# Patient Record
Sex: Female | Born: 1990 | Race: Black or African American | Hispanic: No | Marital: Single | Smoking: Never smoker
Health system: Southern US, Community
[De-identification: ages and names within clinical notes are randomized; demographics above are authoritative.]

## PROBLEM LIST (undated history)

## (undated) DIAGNOSIS — I10 Essential (primary) hypertension: Secondary | ICD-10-CM

## (undated) DIAGNOSIS — Z973 Presence of spectacles and contact lenses: Secondary | ICD-10-CM

## (undated) DIAGNOSIS — R7303 Prediabetes: Secondary | ICD-10-CM

---

## 2011-10-25 ENCOUNTER — Encounter (HOSPITAL_BASED_OUTPATIENT_CLINIC_OR_DEPARTMENT_OTHER): Payer: Self-pay | Admitting: Emergency Medicine

## 2011-10-25 ENCOUNTER — Emergency Department (HOSPITAL_BASED_OUTPATIENT_CLINIC_OR_DEPARTMENT_OTHER): Payer: No Typology Code available for payment source

## 2011-10-25 ENCOUNTER — Emergency Department (HOSPITAL_BASED_OUTPATIENT_CLINIC_OR_DEPARTMENT_OTHER)
Admission: EM | Admit: 2011-10-25 | Discharge: 2011-10-25 | Disposition: A | Discharge status: Home / Self Care | Payer: No Typology Code available for payment source | Attending: Emergency Medicine | Admitting: Emergency Medicine

## 2011-10-25 DIAGNOSIS — M549 Dorsalgia, unspecified: Secondary | ICD-10-CM | POA: Insufficient documentation

## 2011-10-25 DIAGNOSIS — M542 Cervicalgia: Secondary | ICD-10-CM | POA: Insufficient documentation

## 2011-10-25 DIAGNOSIS — M79609 Pain in unspecified limb: Secondary | ICD-10-CM | POA: Insufficient documentation

## 2011-10-25 DIAGNOSIS — M79643 Pain in unspecified hand: Secondary | ICD-10-CM

## 2011-10-25 MED ORDER — CYCLOBENZAPRINE HCL 10 MG PO TABS: 10.0000 mg | ORAL_TABLET | Freq: Two times a day (BID) | ORAL | Status: AC | PRN

## 2011-10-25 MED ORDER — HYDROCODONE-ACETAMINOPHEN 5-325 MG PO TABS: 2.0000 | ORAL_TABLET | ORAL | Status: AC | PRN
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
## 2011-10-26 NOTE — ED Provider Notes (Signed)
Medical screening examination/treatment/procedure(s) were performed by non-physician practitioner and as supervising physician I was immediately available for consultation/collaboration.  Johm Pfannenstiel, MD 10/26/11 2058 

## 2012-07-08 ENCOUNTER — Encounter (HOSPITAL_BASED_OUTPATIENT_CLINIC_OR_DEPARTMENT_OTHER): Payer: Self-pay

## 2012-07-08 ENCOUNTER — Emergency Department (HOSPITAL_BASED_OUTPATIENT_CLINIC_OR_DEPARTMENT_OTHER)
Admission: EM | Admit: 2012-07-08 | Discharge: 2012-07-08 | Disposition: A | Discharge status: Home / Self Care | Payer: Managed Care, Other (non HMO) | Attending: Emergency Medicine | Admitting: Emergency Medicine

## 2012-07-08 DIAGNOSIS — Y929 Unspecified place or not applicable: Secondary | ICD-10-CM | POA: Insufficient documentation

## 2012-07-08 DIAGNOSIS — Y9389 Activity, other specified: Secondary | ICD-10-CM | POA: Insufficient documentation

## 2012-07-08 DIAGNOSIS — T783XXA Angioneurotic edema, initial encounter: Secondary | ICD-10-CM

## 2012-07-08 DIAGNOSIS — T628X1A Toxic effect of other specified noxious substances eaten as food, accidental (unintentional), initial encounter: Secondary | ICD-10-CM | POA: Insufficient documentation

## 2012-07-08 MED ORDER — DIPHENHYDRAMINE HCL 50 MG PO TABS: 50.0000 mg | ORAL_TABLET | Freq: Four times a day (QID) | ORAL | Status: AC

## 2012-07-08 NOTE — ED Provider Notes (Signed)
History     CSN: [id number]  Arrival date & time 07/08/12  4098   None     Chief Complaint  Patient presents with  . Oral Swelling    (Consider location/radiation/quality/duration/timing/severity/associated sxs/prior treatment) HPI 22 yo F presenting with lip swelling.  She states that it started last night after eating [place] food.  She states that it was the same food from the same restaurant that she eats all the time.  No new lip gloss or chapstick.  Denies difficulty swallowing or tongue/throat swelling.  No fevers or chills.  Took some benadryl last night.  History reviewed. No pertinent past medical history.  History reviewed. No pertinent past surgical history.  No family history on file.  History  Substance Use Topics  . Smoking status: Never Smoker   . Smokeless tobacco: Not on file  . Alcohol Use: No    OB History   Grav Para Term Preterm Abortions TAB SAB Ect Mult Living                  Review of Systems  HENT:       Lip swelling  All other systems reviewed and are negative.    Allergies  Review of patient's allergies indicates no known allergies.  Home Medications  No current outpatient prescriptions on file.  BP 137/80  Pulse 76  Temp(Src) 98.1 F (36.7 C) (Oral)  Resp 18  Ht 5\' 10"  (1.778 m)  Wt 300 lb (136.079 kg)  BMI 43.05 kg/m2  SpO2 100%  LMP 06/28/2012  Physical Exam  Constitutional: She is oriented to person, place, and time. She appears well-developed and well-nourished. No distress.  HENT:  Head: Normocephalic and atraumatic.  Mouth/Throat: Oropharynx is clear and moist.    No swelling of tongue or posterior pharynx.  Righ upper lip with some swelling; much improved from last night based on picture.  No surrounding erythema.  Eyes: Conjunctivae are normal. Right eye exhibits no discharge. Left eye exhibits no discharge.  Neck: Normal range of motion.  Cardiovascular: Normal rate, regular rhythm and normal heart sounds.     No murmur heard. Pulmonary/Chest: Effort normal and breath sounds normal. No respiratory distress. She has no wheezes. She has no rales.  Neurological: She is alert and oriented to person, place, and time.  Skin: Skin is warm and dry. She is not diaphoretic.    ED Course  Procedures (including critical care time)  Labs Reviewed - No data to display No results found.   No diagnosis found.    MDM  22 yo otherwise healthy F presenting with lip swelling consistent with allergic reaction.  Unclear precipitant unless the restaurant has changed the seasonings in their food.  Discussed treatment with benadryl 50mg  q6hrs until swelling improves.  Will not give dose here as patient drove herself.  Reviewed warning signs that warrant immediate return including swelling of tongue or throat or difficulty swallowing.  She expressed understanding and agreement with plan.         Phebe Colla, MD 07/08/12 [phone]

## 2012-07-08 NOTE — ED Provider Notes (Signed)
  I performed a history and physical examination of Cassandra Reese and discussed her management with Dr. Despina Hick.  I agree with the history, physical, assessment, and plan of care, with the following exceptions: None  I was present for the following procedures: None Time Spent in Critical Care of the patient: None Time spent in discussions with the patient and family: 61  22 y.o. Female with isolated lip swelling after [place] food yesterday.   Holli Humbles, MD 07/08/12 [phone]

## 2012-07-08 NOTE — ED Notes (Signed)
Upper lip swelling that started last night after eating chinese food.

## 2013-03-11 HISTORY — PX: WISDOM TOOTH EXTRACTION: SHX21

## 2014-11-04 ENCOUNTER — Emergency Department (HOSPITAL_BASED_OUTPATIENT_CLINIC_OR_DEPARTMENT_OTHER)
Admission: EM | Admit: 2014-11-04 | Discharge: 2014-11-04 | Disposition: A | Discharge status: Home / Self Care | Payer: Managed Care, Other (non HMO) | Attending: Emergency Medicine | Admitting: Emergency Medicine

## 2014-11-04 ENCOUNTER — Encounter (HOSPITAL_BASED_OUTPATIENT_CLINIC_OR_DEPARTMENT_OTHER): Payer: Self-pay | Admitting: *Deleted

## 2014-11-04 DIAGNOSIS — L509 Urticaria, unspecified: Secondary | ICD-10-CM | POA: Diagnosis not present

## 2014-11-04 DIAGNOSIS — R21 Rash and other nonspecific skin eruption: Secondary | ICD-10-CM | POA: Diagnosis present

## 2014-11-04 MED ORDER — DIPHENHYDRAMINE HCL 25 MG PO CAPS
25.0000 mg | ORAL_CAPSULE | Freq: Once | ORAL | Status: AC
  Administered 2014-11-04: 25 mg via ORAL
  Filled 2014-11-04: qty 1

## 2014-11-04 NOTE — ED Notes (Signed)
Hives on her chest. No difficulty speaking or swallowing. She has been using Benadryl cream but has not taken PO meds. Recent sinus problems.

## 2014-11-04 NOTE — ED Provider Notes (Signed)
CSN: [id number]     Arrival date & time 11/04/14  1953 History   First MD Initiated Contact with Patient 11/04/14 1959     Chief Complaint  Patient presents with  . Urticaria     (Consider location/radiation/quality/duration/timing/severity/associated sxs/prior Treatment) HPI Comments: 24 year old female presenting with hives on her chest beginning while she was sitting at her desk at work. The hives are very itchy and worsened over the past couple of hours. They have not spread to any other part of her body other than her chest. Denies wheezing, facial swelling, angioedema, difficulty breathing or swallowing. No known allergies. Denies any new soaps, detergents, lotions, foods or medications. Reports dealing with sinus congestion over the past week and has been taking over-the-counter Tylenol but has never had a problem with this in the past. She tried applying Benadryl cream with no relief of the itching.  The history is provided by the patient.    History reviewed. No pertinent past medical history. History reviewed. No pertinent past surgical history. No family history on file. Social History  Substance Use Topics  . Smoking status: Never Smoker   . Smokeless tobacco: None  . Alcohol Use: No   OB History    No data available     Review of Systems  Skin: Positive for rash.  All other systems reviewed and are negative.     Allergies  Review of patient's allergies indicates no known allergies.  Home Medications   Prior to Admission medications   Medication Sig Start Date End Date Taking? Authorizing Provider  diphenhydrAMINE (BENADRYL) 50 MG tablet Take 1 tablet (50 mg total) by mouth every 6 (six) hours. Until swelling resolves 07/08/12   Melony Overly, MD   BP 147/82 mmHg  Pulse 88  Temp(Src) 98.9 F (37.2 C) (Oral)  Resp 18  Ht 5\' 9"  (1.753 m)  Wt 335 lb (151.955 kg)  BMI 49.45 kg/m2  SpO2 99%  LMP 10/08/2014 Physical Exam  Constitutional: She is oriented to  person, place, and time. She appears well-developed and well-nourished. No distress.  HENT:  Head: Normocephalic and atraumatic.  Mouth/Throat: Oropharynx is clear and moist. No uvula swelling.  No angioedema.  Eyes: Conjunctivae and EOM are normal.  Neck: Normal range of motion. Neck supple.  Cardiovascular: Normal rate, regular rhythm and normal heart sounds.   Pulmonary/Chest: Effort normal and breath sounds normal. No respiratory distress.  Musculoskeletal: Normal range of motion. She exhibits no edema.  Neurological: She is alert and oriented to person, place, and time. No sensory deficit.  Skin: Skin is warm and dry.  Few urticaria on chest.  Psychiatric: She has a normal mood and affect. Her behavior is normal.  Nursing note and vitals reviewed.   ED Course  Procedures (including critical care time) Labs Review Labs Reviewed - No data to display  Imaging Review No results found. I have personally reviewed and evaluated these images and lab results as part of my medical decision-making.   EKG Interpretation None      MDM   Final diagnoses:  Urticaria   NAD. AFVSS. No anaphylaxis. Has localized hives to her chest. Advised over-the-counter Benadryl, if no improvement, topical hydrocortisone cream. Stable for discharge. Return precautions given. Patient states understanding of treatment care plan and is agreeable.  Carman Ching, PA-C 11/04/14 2021  Quintella Reichert, MD 11/04/14 2056

## 2014-11-04 NOTE — ED Notes (Signed)
Pt verbalizes understanding of d/c instructions and denies any further needs at this time. 

## 2014-11-04 NOTE — Discharge Instructions (Signed)
Take Benadryl every 4-6 hours as needed for itching and hives. Return with any worsening symptoms.  Hives Hives are itchy, red, swollen areas of the skin. They can vary in size and location on your body. Hives can come and go for hours or several days (acute hives) or for several weeks (chronic hives). Hives do not spread from person to person (noncontagious). They may get worse with scratching, exercise, and emotional stress. CAUSES   Allergic reaction to food, additives, or drugs.  Infections, including the common cold.  Illness, such as vasculitis, lupus, or thyroid disease.  Exposure to sunlight, heat, or cold.  Exercise.  Stress.  Contact with chemicals. SYMPTOMS   Red or white swollen patches on the skin. The patches may change size, shape, and location quickly and repeatedly.  Itching.  Swelling of the hands, feet, and face. This may occur if hives develop deeper in the skin. DIAGNOSIS  Your caregiver can usually tell what is wrong by performing a physical exam. Skin or blood tests may also be done to determine the cause of your hives. In some cases, the cause cannot be determined. TREATMENT  Mild cases usually get better with medicines such as antihistamines. Severe cases may require an emergency epinephrine injection. If the cause of your hives is known, treatment includes avoiding that trigger.  HOME CARE INSTRUCTIONS   Avoid causes that trigger your hives.  Take antihistamines as directed by your caregiver to reduce the severity of your hives. Non-sedating or low-sedating antihistamines are usually recommended. Do not drive while taking an antihistamine.  Take any other medicines prescribed for itching as directed by your caregiver.  Wear loose-fitting clothing.  Keep all follow-up appointments as directed by your caregiver. SEEK MEDICAL CARE IF:   You have persistent or severe itching that is not relieved with medicine.  You have painful or swollen  joints. SEEK IMMEDIATE MEDICAL CARE IF:   You have a fever.  Your tongue or lips are swollen.  You have trouble breathing or swallowing.  You feel tightness in the throat or chest.  You have abdominal pain. These problems may be the first sign of a life-threatening allergic reaction. Call your local emergency services (911 in [place] MAKE SURE YOU:   Understand these instructions.  Will watch your condition.  Will get help right away if you are not doing well or get worse. Document Released: 02/25/2005 Document Revised: 03/02/2013 Document Reviewed: 05/21/2011 [facility] Patient Information 2015 [place], [place]. This information is not intended to replace advice given to you by your health care provider. Make sure you discuss any questions you have with your health care provider.

## 2017-05-12 ENCOUNTER — Other Ambulatory Visit (HOSPITAL_COMMUNITY): Payer: Self-pay | Admitting: General Surgery

## 2017-05-27 ENCOUNTER — Ambulatory Visit (HOSPITAL_COMMUNITY)
Admission: RE | Admit: 2017-05-27 | Discharge: 2017-05-27 | Disposition: A | Discharge status: Home / Self Care | Payer: BLUE CROSS/BLUE SHIELD | Source: Ambulatory Visit | Attending: General Surgery | Admitting: General Surgery

## 2017-05-27 ENCOUNTER — Encounter: Payer: BLUE CROSS/BLUE SHIELD | Attending: General Surgery | Admitting: Skilled Nursing Facility1

## 2017-05-27 ENCOUNTER — Encounter: Payer: Self-pay | Admitting: Skilled Nursing Facility1

## 2017-05-27 ENCOUNTER — Other Ambulatory Visit: Payer: Self-pay

## 2017-05-27 DIAGNOSIS — Z713 Dietary counseling and surveillance: Secondary | ICD-10-CM | POA: Diagnosis present

## 2017-05-27 DIAGNOSIS — E669 Obesity, unspecified: Secondary | ICD-10-CM

## 2017-05-27 DIAGNOSIS — Z6841 Body Mass Index (BMI) 40.0 and over, adult: Secondary | ICD-10-CM | POA: Diagnosis not present

## 2017-05-27 NOTE — Progress Notes (Signed)
Pre-Op Assessment Visit:  Pre-Operative Sleeve Gastrectomy Surgery  Medical Nutrition Therapy:  Appt start time: 3:30  End time:  4:18  Patient was seen on 05/27/2017 for Pre-Operative Nutrition Assessment. Assessment and letter of approval faxed to [facility] Bariatric Surgery Program coordinator on 05/27/2017.  Pt needs 6 SWL.  Pt seems to be in a really good place mentally concerning food ,weight, and surgery.  Pt expectation of surgery: more discipline and more accountable   Pt expectation of Dietitian: to give me some good tips  Start weight at [facility]: 371.6 BMI: 54.88  24 hr Dietary Recall: First Meal: yogurt or boiled egg or cereal or bagel Snack: candy Second Meal: chicken or fish and mixed vegetables and rice or mac n cheese Snack: chips and dip and crackers  Third Meal: small side salad or cereal Snack:  Beverages:water, cream and sugar coffee, lemonade, orange juice  Encouraged to engage in 150 minutes of moderate physical activity including cardiovascular and weight baring weekly  Handouts given during visit include:  . Pre-Op Goals . Bariatric Surgery Protein Shakes During the appointment today the following Pre-Op Goals were reviewed with the patient: . Maintain or lose weight as instructed by your surgeon . Make healthy food choices . Begin to limit portion sizes . Limited concentrated sugars and fried foods . Keep fat/sugar in the single digits per serving on             food labels . Practice CHEWING your food  (aim for 30 chews per bite or until applesauce consistency) . Practice not drinking 15 minutes before, during, and 30 minutes after each meal/snack . Avoid all carbonated beverages  . Avoid/limit caffeinated beverages  . Avoid all sugar-sweetened beverages . Consume 3 meals per day; eat every 3-5 hours . Make a list of non-food related activities . Aim for 64-100 ounces of FLUID daily  . Aim for at least 60-80 grams of PROTEIN  daily . Look for a liquid protein source that contain ?15 g protein and ?5 g carbohydrate  (ex: shakes, drinks, shots)  -Follow diet recommendations listed below   Energy and Macronutrient Recomendations: Calories: 1500 Carbohydrate: 170 Protein: 112 Fat: 42  Demonstrated degree of understanding via:  Teach Back  Teaching Method Utilized: Visual Auditory Hands on  Barriers to learning/adherence to lifestyle change: none identified   Patient to call the Nutrition and Diabetes Education Services to enroll in Pre-Op and Post-Op Nutrition Education when surgery date is scheduled.

## 2017-06-19 ENCOUNTER — Encounter: Payer: BLUE CROSS/BLUE SHIELD | Attending: General Surgery | Admitting: Skilled Nursing Facility1

## 2017-06-19 ENCOUNTER — Encounter: Payer: Self-pay | Admitting: Skilled Nursing Facility1

## 2017-06-19 DIAGNOSIS — Z713 Dietary counseling and surveillance: Secondary | ICD-10-CM | POA: Insufficient documentation

## 2017-06-19 DIAGNOSIS — E669 Obesity, unspecified: Secondary | ICD-10-CM

## 2017-06-19 DIAGNOSIS — Z6841 Body Mass Index (BMI) 40.0 and over, adult: Secondary | ICD-10-CM | POA: Insufficient documentation

## 2017-06-19 NOTE — Progress Notes (Signed)
Assessment:   1st SWL Appointment.  Sleeve Pt states she has increased her water consumption goal to fill her bottle 3 times. Pt states she is more conscious of her chewing. Pt states she has had less coffees but still drinking mocha frapps. Pt states pork and beef are tough for her to digest so she is trying to cut them out. Pt states if her schedule gets really busy she eats out more. Pt states he has church 2 tiems Sunday and eats out more on the weekend. Pt states she is going to [place] in a few days.   Start weight at [facility]: 371.6 Weight: 371.6 BMI: 54.88  24 hr Dietary Recall: First Meal: protein shake or grits and eggs Snack: candy Second Meal: chicken or fish and mixed vegetables and rice or mac n cheese Snack: chips and dip and crackers  Third Meal: small side salad or cereal Snack:  Beverages:water, mocha frapps, lemonade, orange juice  -Follow diet recommendations listed below   Energy and Macronutrient Recomendations: Calories: 1500 Carbohydrate: 170 Protein: 112 Fat: 42  Demonstrated degree of understanding via:  Teach Back  Teaching Method Utilized: Visual Auditory Hands on  Barriers to learning/adherence to lifestyle change: none identified  Nutritional Diagnosis:  [place]-3.3 Overweight/obesity related to past poor dietary habits and physical inactivity as evidenced by patient w/ planned sleeve surgery following dietary guidelines for continued weight loss.    Intervention:  Nutrition counseling for upcoming Bariatric Surgery. Goals: -Encouraged to engage in 150 minutes of moderate physical activity including cardiovascular and weight baring weekly . Practice CHEWING your food  (aim for 30 chews per bite or until applesauce consistency) . Use your meal ideas sheet to cut back on fast food  Teaching Method Utilized:  Visual Auditory Hands on  Handouts given during visit include:  Meal ideas  Low sodium seasonings   Barriers to learning/adherence to  lifestyle change: none identified   Demonstrated degree of understanding via:  Teach Back   Monitoring/Evaluation:  Dietary intake, exercise,and body weight prn.

## 2017-06-25 ENCOUNTER — Ambulatory Visit: Payer: BLUE CROSS/BLUE SHIELD | Admitting: Skilled Nursing Facility1

## 2017-07-16 ENCOUNTER — Encounter: Payer: BLUE CROSS/BLUE SHIELD | Attending: General Surgery | Admitting: Skilled Nursing Facility1

## 2017-07-16 ENCOUNTER — Encounter: Payer: Self-pay | Admitting: Skilled Nursing Facility1

## 2017-07-16 DIAGNOSIS — Z6841 Body Mass Index (BMI) 40.0 and over, adult: Secondary | ICD-10-CM | POA: Insufficient documentation

## 2017-07-16 DIAGNOSIS — Z713 Dietary counseling and surveillance: Secondary | ICD-10-CM | POA: Insufficient documentation

## 2017-07-16 DIAGNOSIS — E669 Obesity, unspecified: Secondary | ICD-10-CM

## 2017-07-16 NOTE — Progress Notes (Signed)
Assessment:  2nd SWL Appointment.  Sleeve  Pt arrives with 5.8 pounds lost. Pt states she had a fantastic time in [place]. Pt states she has been working on chewing well/not drinking with the meals and feels there is room for improvement. Pt states she has been half and half with fast food and has been drinking 96 oz of water.    Start weight at [facility]: 371.6 Weight:365.8 BMI: 54.02  24 hr Dietary Recall: First Meal: protein shake or grits and eggs Snack: candy Second Meal: chicken or fish and mixed vegetables and rice or mac n cheese Snack: chips and dip and crackers  Third Meal: small side salad or cereal Snack:  Beverages:water, mocha frapps, lemonade, orange juice  -Follow diet recommendations listed below   Energy and Macronutrient Recomendations: Calories: 1500 Carbohydrate: 170 Protein: 112 Fat: 42  Demonstrated degree of understanding via:  Teach Back  Teaching Method Utilized: Visual Auditory Hands on  Barriers to learning/adherence to lifestyle change: none identified  Nutritional Diagnosis:  [place]-3.3 Overweight/obesity related to past poor dietary habits and physical inactivity as evidenced by patient w/ planned sleeve surgery following dietary guidelines for continued weight loss.    Intervention:  Nutrition counseling for upcoming Bariatric Surgery. Goals: -Encouraged to engage in 150 minutes of moderate physical activity including cardiovascular and weight baring weekly . Practice CHEWING your food  (aim for 30 chews per bite or until applesauce consistency) . Use your meal ideas sheet to cut back on fast food  Teaching Method Utilized:  Visual Auditory Hands on  Handouts given during visit include:  Meal ideas  Low sodium seasonings   Barriers to learning/adherence to lifestyle change: none identified   Demonstrated degree of understanding via:  Teach Back   Monitoring/Evaluation:  Dietary intake, exercise,and body weight prn.

## 2017-08-20 ENCOUNTER — Encounter: Payer: Self-pay | Admitting: Skilled Nursing Facility1

## 2017-08-20 ENCOUNTER — Encounter: Payer: BLUE CROSS/BLUE SHIELD | Attending: General Surgery | Admitting: Skilled Nursing Facility1

## 2017-08-20 DIAGNOSIS — E669 Obesity, unspecified: Secondary | ICD-10-CM

## 2017-08-20 DIAGNOSIS — Z6841 Body Mass Index (BMI) 40.0 and over, adult: Secondary | ICD-10-CM | POA: Insufficient documentation

## 2017-08-20 DIAGNOSIS — Z713 Dietary counseling and surveillance: Secondary | ICD-10-CM | POA: Diagnosis not present

## 2017-08-20 NOTE — Progress Notes (Signed)
Assessment: 3rd SWL Appointment.  Sleeve Needs 6 SWL.  Pt arrives with 5.8 pounds lost. Pt states she had a fantastic time in [place]. Pt states she has been working on chewing well/not drinking with the meals and feels there is room for improvement. Pt states she has been half and half with fast food and has been drinking 96 oz of water.    Pt states she has been not drinking with meals and drinking more smoothies as meal replacements. Pt states she has had less snacking by stop buying the snacks. Pt states she is going to [place]. Pt states she wants to aim for 3 times at the gym.   Start weight at [facility]: 371.6 Weight:365.8 BMI: 54.02  24 hr Dietary Recall: First Meal: pureed fruit Snack: candy but less Second Meal: chicken or fish and mixed vegetables and rice or mac n cheese Snack: chips and dip and crackers  Third Meal: small side salad or cereal Snack:  Beverages:water, mocha frapps, lemonade, orange juice  -Follow diet recommendations listed below   Energy and Macronutrient Recomendations: Calories: 1500 Carbohydrate: 170 Protein: 112 Fat: 42  Demonstrated degree of understanding via:  Teach Back  Teaching Method Utilized: Visual Auditory Hands on  Barriers to learning/adherence to lifestyle change: none identified  Nutritional Diagnosis:  [place]-3.3 Overweight/obesity related to past poor dietary habits and physical inactivity as evidenced by patient w/ planned sleeve surgery following dietary guidelines for continued weight loss.    Intervention:  Nutrition counseling for upcoming Bariatric Surgery. Goals: -Encouraged to engage in 150 minutes of moderate physical activity including cardiovascular and weight baring weekly . Practice CHEWING your food  (aim for 30 chews per bite or until applesauce consistency) . Use your meal ideas sheet to cut back on fast food . Get to the gym 3 days a week  Teaching Method Utilized:  Visual Auditory Hands on  Handouts  given during visit include:  Meal ideas  Low sodium seasonings   Barriers to learning/adherence to lifestyle change: none identified   Demonstrated degree of understanding via:  Teach Back   Monitoring/Evaluation:  Dietary intake, exercise,and body weight prn.

## 2017-09-18 ENCOUNTER — Encounter: Payer: BLUE CROSS/BLUE SHIELD | Attending: General Surgery | Admitting: Skilled Nursing Facility1

## 2017-09-18 ENCOUNTER — Encounter: Payer: Self-pay | Admitting: Skilled Nursing Facility1

## 2017-09-18 DIAGNOSIS — Z713 Dietary counseling and surveillance: Secondary | ICD-10-CM | POA: Insufficient documentation

## 2017-09-18 DIAGNOSIS — Z6841 Body Mass Index (BMI) 40.0 and over, adult: Secondary | ICD-10-CM | POA: Diagnosis not present

## 2017-09-18 DIAGNOSIS — E669 Obesity, unspecified: Secondary | ICD-10-CM

## 2017-09-18 NOTE — Progress Notes (Signed)
Assessment: 3rd SWL Appointment.  Sleeve Needs 6 SWL.  Pt arrives with 5.8 pounds lost. Pt states she had a fantastic time in [place]. Pt states she has been working on chewing well/not drinking with the meals and feels there is room for improvement. Pt states she has been half and half with fast food and has been drinking 96 oz of water.    Pt states she has been not drinking with meals and drinking more smoothies as meal replacements.   Pt arrives having lost about 1 pound. Pt state she no longer has snacks at her desk. Pt states she is eating until satisfaction instead of full and throwing the rest of her food away.    Start weight at [facility]: 371.6 Weight:364 BMI: 54.02  24 hr Dietary Recall: First Meal: pureed fruit or coffee drink or shake or breakfast sandwich Snack: candy but less Second Meal: sandwich or salad Snack:  Third Meal: small side salad or cereal or leftovers from lunch like chinese or habachi Snack:  Beverages:water, mocha frapps, lemonade, orange juice  -Follow diet recommendations listed below   Energy and Macronutrient Recomendations: Calories: 1500 Carbohydrate: 170 Protein: 112 Fat: 42  Demonstrated degree of understanding via:  Teach Back  Teaching Method Utilized: Visual Auditory Hands on  Barriers to learning/adherence to lifestyle change: none identified  Nutritional Diagnosis:  [place]-3.3 Overweight/obesity related to past poor dietary habits and physical inactivity as evidenced by patient w/ planned sleeve surgery following dietary guidelines for continued weight loss.    Intervention:  Nutrition counseling for upcoming Bariatric Surgery. Goals: -Encouraged to engage in 150 minutes of moderate physical activity including cardiovascular and weight baring weekly . Practice CHEWING your food  (aim for 30 chews per bite or until applesauce consistency) . Use your meal ideas sheet to cut back on fast food . Get to the gym 3 days a week: starting this  coming Monday  . Try kefir  Teaching Method Utilized:  Visual Auditory Hands on  Handouts given during visit include:  Meal ideas  Low sodium seasonings   Barriers to learning/adherence to lifestyle change: none identified   Demonstrated degree of understanding via:  Teach Back   Monitoring/Evaluation:  Dietary intake, exercise,and body weight prn.

## 2017-10-21 ENCOUNTER — Encounter: Payer: Self-pay | Admitting: Skilled Nursing Facility1

## 2017-10-21 ENCOUNTER — Encounter: Payer: BLUE CROSS/BLUE SHIELD | Attending: General Surgery | Admitting: Skilled Nursing Facility1

## 2017-10-21 DIAGNOSIS — E669 Obesity, unspecified: Secondary | ICD-10-CM

## 2017-10-21 DIAGNOSIS — Z6841 Body Mass Index (BMI) 40.0 and over, adult: Secondary | ICD-10-CM | POA: Diagnosis not present

## 2017-10-21 DIAGNOSIS — Z713 Dietary counseling and surveillance: Secondary | ICD-10-CM | POA: Diagnosis present

## 2017-10-21 NOTE — Progress Notes (Signed)
Assessment: 5th SWL Appointment.  Sleeve Needs 6 SWL.   Pt arrives having lost about 1 pound. Pt states she has been walking 2 miles a day and states she love it. Pt states she has been packing her lunches for work and eating out 2 times a week. Pt states she is not beating herself up for missing the gym. Pt states she really enjoys sweating because ot makes her feel accomplished. Pt states she [place] disappointed with how hard she has worked but has only lost 1 pound.    Start weight at [facility]: 371.6 Weight:363 BMI: 53.61  24 hr Dietary Recall: First Meal: pureed fruit or coffee drink or shake or breakfast sandwich Snack: candy but less Second Meal: sandwich or salad Snack:  Third Meal: small side salad or cereal or leftovers from lunch like chinese or habachi Snack:  Beverages:water, mocha frapps, lemonade, orange juice  -Follow diet recommendations listed below   Energy and Macronutrient Recomendations: Calories: 1500 Carbohydrate: 170 Protein: 112 Fat: 42  Demonstrated degree of understanding via:  Teach Back  Teaching Method Utilized: Visual Auditory Hands on  Barriers to learning/adherence to lifestyle change: none identified  Nutritional Diagnosis:  [place]-3.3 Overweight/obesity related to past poor dietary habits and physical inactivity as evidenced by patient w/ planned sleeve surgery following dietary guidelines for continued weight loss.    Intervention:  Nutrition counseling for upcoming Bariatric Surgery. Goals: -Encouraged to engage in 150 minutes of moderate physical activity including cardiovascular and weight baring weekly . Practice CHEWING your food  (aim for 30 chews per bite or until applesauce consistency) . Use your meal ideas sheet to cut back on fast food . Get to the gym 3 days a week: starting this coming Monday  . Try kefir -Keep working on it! Great Job! Teaching Method Utilized:  Visual Auditory Hands on  Handouts given during visit  include:  Meal ideas  Low sodium seasonings   Barriers to learning/adherence to lifestyle change: none identified   Demonstrated degree of understanding via:  Teach Back   Monitoring/Evaluation:  Dietary intake, exercise,and body weight prn.

## 2017-11-24 ENCOUNTER — Encounter: Payer: BLUE CROSS/BLUE SHIELD | Attending: General Surgery | Admitting: Skilled Nursing Facility1

## 2017-11-24 ENCOUNTER — Encounter: Payer: Self-pay | Admitting: Skilled Nursing Facility1

## 2017-11-24 DIAGNOSIS — Z713 Dietary counseling and surveillance: Secondary | ICD-10-CM | POA: Insufficient documentation

## 2017-11-24 DIAGNOSIS — Z6841 Body Mass Index (BMI) 40.0 and over, adult: Secondary | ICD-10-CM | POA: Insufficient documentation

## 2017-11-24 DIAGNOSIS — E669 Obesity, unspecified: Secondary | ICD-10-CM

## 2017-11-24 NOTE — Progress Notes (Signed)
Assessment: 6th SWL Appointment.  Sleeve Needs 6 SWL.   Pt arrives having lost about 2 pounds. Pt states she will struggle with drinking out of a [place]. Pt states she has no longer snacked on snack foods and eats out of boredom which she feels very proud of.   Physical activity: gym 1-2 times a week and moving more throughout the week  Start weight at [facility]: 371.6 Weight:361.4 BMI: 53.37  24 hr Dietary Recall: First Meal: pureed fruit or coffee drink or shake or breakfast sandwich or yogurt Snack:  Second Meal: sandwich or salad Snack:  Third Meal: small side salad or cereal or leftovers from lunch like chinese or habachi or fish with broccoli and brown rice  Snack:  Beverages: water, mocha frapps, lemonade, orange juice  -Follow diet recommendations listed below   Energy and Macronutrient Recomendations: Calories: 1500 Carbohydrate: 170 Protein: 112 Fat: 42  Demonstrated degree of understanding via:  Teach Back  Teaching Method Utilized: Visual Auditory Hands on  Barriers to learning/adherence to lifestyle change: none identified  Nutritional Diagnosis:  [place]-3.3 Overweight/obesity related to past poor dietary habits and physical inactivity as evidenced by patient w/ planned sleeve surgery following dietary guidelines for continued weight loss.    Intervention:  Nutrition counseling for upcoming Bariatric Surgery. Goals: -Encouraged to engage in 150 minutes of moderate physical activity including cardiovascular and weight baring weekly . Practice CHEWING your food  (aim for 30 chews per bite or until applesauce consistency) . Use your meal ideas sheet to cut back on fast food . Get to the gym 3 days a week: starting this coming Monday  -Keep working on it! Great Job! Teaching Method Utilized:  Visual Auditory Hands on  Handouts given during visit include:  Meal ideas  Low sodium seasonings   Barriers to learning/adherence to lifestyle change: none identified    Demonstrated degree of understanding via:  Teach Back   Monitoring/Evaluation:  Dietary intake, exercise,and body weight prn.

## 2017-12-08 ENCOUNTER — Encounter: Payer: BLUE CROSS/BLUE SHIELD | Admitting: Skilled Nursing Facility1

## 2017-12-08 DIAGNOSIS — E669 Obesity, unspecified: Secondary | ICD-10-CM

## 2017-12-08 DIAGNOSIS — Z713 Dietary counseling and surveillance: Secondary | ICD-10-CM | POA: Diagnosis not present

## 2017-12-10 ENCOUNTER — Encounter: Payer: Self-pay | Admitting: Skilled Nursing Facility1

## 2017-12-10 NOTE — Progress Notes (Signed)
Pre-Operative Nutrition Class:  Appt start time: 8979   End time:  1830.  Patient was seen on 12/08/2017 for Pre-Operative Bariatric Surgery Education at the [facility].   Surgery date:  Surgery type: sleeve [facility] weight at [facility]: 371.6 Weight today: 369.8  Samples given per MNT protocol. Patient educated on appropriate usage: Bariatric Advantage Multivitamin Lot # [id number] Exp: 10/20  Bariatric Advantage Calcium  Lot # [id number] Exp: aug 23/2020   Unjury Protein Powder  Shake Lot # [id number] Exp: march 08/20 The following the learning objectives were met by the patient during this course:  Identify Pre-Op Dietary Goals and will begin 2 weeks pre-operatively  Identify appropriate sources of fluids and proteins   State protein recommendations and appropriate sources pre and post-operatively  Identify Post-Operative Dietary Goals and will follow for 2 weeks post-operatively  Identify appropriate multivitamin and calcium sources  Describe the need for physical activity post-operatively and will follow MD recommendations  State when to call healthcare provider regarding medication questions or post-operative complications  Handouts given during class include:  Pre-Op Bariatric Surgery Diet Handout  Protein Shake Handout  Post-Op Bariatric Surgery Nutrition Handout  BELT Program Information Flyer  Support Group Information Flyer  [facility] Bariatric Supplements Price List  Follow-[facility] Plan: [facility] for diet advancement per MD.

## 2019-02-20 DIAGNOSIS — U071 COVID-19: Secondary | ICD-10-CM

## 2019-02-20 HISTORY — DX: COVID-19: U07.1

## 2019-06-28 ENCOUNTER — Telehealth: Payer: Self-pay | Admitting: Family

## 2019-06-28 NOTE — Telephone Encounter (Signed)
lmom and mailed appointment letter to confirm patient referral appointment 5/5 at 130 pm

## 2019-07-09 ENCOUNTER — Other Ambulatory Visit: Payer: Self-pay | Admitting: Family

## 2019-07-09 DIAGNOSIS — D649 Anemia, unspecified: Secondary | ICD-10-CM

## 2019-07-12 ENCOUNTER — Encounter: Payer: Self-pay | Admitting: Family

## 2019-07-12 ENCOUNTER — Other Ambulatory Visit: Payer: Self-pay | Admitting: Family

## 2019-07-12 ENCOUNTER — Inpatient Hospital Stay: Payer: BC Managed Care – PPO | Attending: Family

## 2019-07-12 ENCOUNTER — Inpatient Hospital Stay (HOSPITAL_BASED_OUTPATIENT_CLINIC_OR_DEPARTMENT_OTHER): Payer: BC Managed Care – PPO | Admitting: Family

## 2019-07-12 ENCOUNTER — Other Ambulatory Visit: Payer: Self-pay

## 2019-07-12 VITALS — BP 140/74 | HR 72 | Temp 97.3°F | Resp 18 | Ht 69.0 in | Wt 355.0 lb

## 2019-07-12 DIAGNOSIS — N921 Excessive and frequent menstruation with irregular cycle: Secondary | ICD-10-CM | POA: Diagnosis not present

## 2019-07-12 DIAGNOSIS — Z8616 Personal history of COVID-19: Secondary | ICD-10-CM | POA: Diagnosis not present

## 2019-07-12 DIAGNOSIS — D500 Iron deficiency anemia secondary to blood loss (chronic): Secondary | ICD-10-CM | POA: Insufficient documentation

## 2019-07-12 DIAGNOSIS — D649 Anemia, unspecified: Secondary | ICD-10-CM

## 2019-07-12 HISTORY — DX: Excessive and frequent menstruation with irregular cycle: N92.1

## 2019-07-12 HISTORY — DX: Iron deficiency anemia secondary to blood loss (chronic): D50.0

## 2019-07-12 LAB — IRON AND TIBC
Iron: 7 ug/dL — ABNORMAL LOW (ref 41–142)
Saturation Ratios: 2 % — ABNORMAL LOW (ref 21–57)
TIBC: 359 ug/dL (ref 236–444)
UIBC: 352 ug/dL (ref 120–384)

## 2019-07-12 LAB — CBC WITH DIFFERENTIAL (CANCER CENTER ONLY)
Abs Immature Granulocytes: 0.07 10*3/uL (ref 0.00–0.07)
Basophils Absolute: 0 10*3/uL (ref 0.0–0.1)
Basophils Relative: 0 %
Eosinophils Absolute: 0 10*3/uL (ref 0.0–0.5)
Eosinophils Relative: 1 %
HCT: 28.8 % — ABNORMAL LOW (ref 36.0–46.0)
Hemoglobin: 7.8 g/dL — ABNORMAL LOW (ref 12.0–15.0)
Immature Granulocytes: 1 %
Lymphocytes Relative: 33 %
Lymphs Abs: 2.2 10*3/uL (ref 0.7–4.0)
MCH: 16.6 pg — ABNORMAL LOW (ref 26.0–34.0)
MCHC: 27.1 g/dL — ABNORMAL LOW (ref 30.0–36.0)
MCV: 61.3 fL — ABNORMAL LOW (ref 80.0–100.0)
Monocytes Absolute: 0.4 10*3/uL (ref 0.1–1.0)
Monocytes Relative: 7 %
Neutro Abs: 3.9 10*3/uL (ref 1.7–7.7)
Neutrophils Relative %: 58 %
Platelet Count: 424 10*3/uL — ABNORMAL HIGH (ref 150–400)
RBC: 4.7 MIL/uL (ref 3.87–5.11)
RDW: 18.1 % — ABNORMAL HIGH (ref 11.5–15.5)
WBC Count: 6.6 10*3/uL (ref 4.0–10.5)
nRBC: 0 % (ref 0.0–0.2)

## 2019-07-12 LAB — CMP (CANCER CENTER ONLY)
ALT: 7 U/L (ref 0–44)
AST: 10 U/L — ABNORMAL LOW (ref 15–41)
Albumin: 3.7 g/dL (ref 3.5–5.0)
Alkaline Phosphatase: 61 U/L (ref 38–126)
Anion gap: 7 (ref 5–15)
BUN: 7 mg/dL (ref 6–20)
CO2: 29 mmol/L (ref 22–32)
Calcium: 9.1 mg/dL (ref 8.9–10.3)
Chloride: 103 mmol/L (ref 98–111)
Creatinine: 0.69 mg/dL (ref 0.44–1.00)
GFR, Est AFR Am: >60 mL/min (ref >60)
GFR, Estimated: >60 mL/min (ref >60)
Glucose, Bld: 101 mg/dL — ABNORMAL HIGH (ref 70–99)
Potassium: 3.9 mmol/L (ref 3.5–5.1)
Sodium: 139 mmol/L (ref 135–145)
Total Bilirubin: 0.4 mg/dL (ref 0.3–1.2)
Total Protein: 7.2 g/dL (ref 6.5–8.1)

## 2019-07-12 LAB — LACTATE DEHYDROGENASE: LDH: 200 U/L — ABNORMAL HIGH (ref 98–192)

## 2019-07-12 LAB — RETICULOCYTES
Immature Retic Fract: 18.9 % — ABNORMAL HIGH (ref 2.3–15.9)
RBC.: 4.7 MIL/uL (ref 3.87–5.11)
Retic Count, Absolute: 56.9 10*3/uL (ref 19.0–186.0)
Retic Ct Pct: 1.2 % (ref 0.4–3.1)

## 2019-07-12 LAB — SAVE SMEAR(SSMR), FOR PROVIDER SLIDE REVIEW

## 2019-07-12 LAB — FERRITIN: Ferritin: 7 ng/mL — ABNORMAL LOW (ref 11–307)

## 2019-07-12 NOTE — Progress Notes (Signed)
Hematology/Oncology Consultation   Name: Cassandra Reese      MRN: [id number]    Location: Room/bed info not found  Date: 07/12/2019 Time:9:01 AM   REFERRING PHYSICIAN: Linda Hedges, DO  REASON FOR CONSULT: Anemia    DIAGNOSIS:   HISTORY OF PRESENT ILLNESS: Cassandra Reese is a very pleasant 29 yo African American female with history of iron deficiency anemia secondary to heavy cycles.  She has an appointment next week for transvaginal US. She states that her uterus is enlarged.  Her cycles last 5 days with the first 2 being heavy and she has noted clots.  No other blood loss noted. No bruising or petechiae.  She denies pregnancy or miscarriage.  She is symptomatic with fatigued, weakness, SOB at times, occasional numbess and tingling in her hands and feet which seems positional.  She states that most of her immediate family has issues with anemia as well.  There is the sickle cell trait in her family but she thinks she was previously tested and was negative.   No personal or familial cancer history.  She had her wisdom teeth removed in 2015 without any complications.  She had Covid in December and thankfully has recovered nicely.  No fever, chills, n/v, cough, rash, dizziness, chest pain, palpitations, abdominal pain or changes in bowel or bladder habits.  She has occasional puffiness in her feet and ankles when she hasn't had enough water.  No swelling or tenderness in her extremities at this time.  No falls or syncope.  She has maintained a good appetite and is staying well hydrated. Her weight is stable.  She does not smoke, use recreational drugs or drink alcoholic beverages.  She works in Editor, commissioning for [employer].   ROS: All other 10 point review of systems is negative.   PAST MEDICAL HISTORY:   No past medical history on file.  ALLERGIES: No Known Allergies    MEDICATIONS:  Current Outpatient Medications on File Prior to Visit  Medication Sig Dispense Refill  .  diphenhydrAMINE (BENADRYL) 50 MG tablet Take 1 tablet (50 mg total) by mouth every 6 (six) hours. Until swelling resolves 30 tablet 0   No current facility-administered medications on file prior to visit.     PAST SURGICAL HISTORY No past surgical history on file.  FAMILY HISTORY: No family history on file.  SOCIAL HISTORY:  reports that she has never smoked. She does not have any smokeless tobacco history on file. She reports that she does not drink alcohol or use drugs.  PERFORMANCE STATUS: The patient's performance status is 1 - Symptomatic but completely ambulatory  PHYSICAL EXAM: Most Recent Vital Signs: There were no vitals taken for this visit. There were no vitals taken for this visit.  General Appearance:    Alert, cooperative, no distress, appears stated age  Head:    Normocephalic, without obvious abnormality, atraumatic  Eyes:    PERRL, conjunctiva/corneas clear, EOM's intact, fundi    benign, both eyes        Throat:   Lips, mucosa, and tongue normal; teeth and gums normal  Neck:   Supple, symmetrical, trachea midline, no adenopathy;    thyroid:  no enlargement/tenderness/nodules; no carotid   bruit or JVD  Back:     Symmetric, no curvature, ROM normal, no CVA tenderness  Lungs:     Clear to auscultation bilaterally, respirations unlabored  Chest Wall:    No tenderness or deformity   Heart:    Regular rate and rhythm,  S1 and S2 normal, no murmur, rub   or gallop     Abdomen:     Soft, non-tender, bowel sounds active all four quadrants,    no masses, no organomegaly        Extremities:   Extremities normal, atraumatic, no cyanosis or edema  Pulses:   2+ and symmetric all extremities  Skin:   Skin color, texture, turgor normal, no rashes or lesions  Lymph nodes:   Cervical, supraclavicular, and axillary nodes normal  Neurologic:   CNII-XII intact, normal strength, sensation and reflexes    throughout    LABORATORY DATA:  No results found for this or any  previous visit (from the past 48 hour(s)).    RADIOGRAPHY: No results found.     PATHOLOGY: None   ASSESSMENT/PLAN: Cassandra Reese is a very pleasant 29 yo African American female with history of iron deficiency anemia secondary to heavy cycles.  Hgb is low at 7.8 and MCV 61.  We will see what her iron studies and thalassemia testing look like.  She may benefit from IV iron as well as folic acid.  Once we have her results we will get follow-up scheduled.   All questions were answered and she is in agreement. She will contact our office with any questions or concerns. We can certainly see her sooner if needed.   She was discussed with and also seen by Dr. Marin Olp and he is in agreement with the aforementioned.   Laverna Peace, NP    Addendum: I saw and examined Cassandra Reese with Judson Roch.  I agree with Meghan Warshawsky's assessment.  Clearly, we have significant iron deficiency anemia.  Her MCV is only 61.  Her platelets are on the higher side at 4-24,000.  She has heavy monthly cycles.  I would like to hope that the gynecologist will be able to help out with this.  Somehow, the monthly cycles need to slow down a little bit.  There is no history of heavy bleeding in the family.  No history of sickle cell.  I looked at her blood smear.  I really do not see anything on the blood smear that looked suspicious.  She had no nucleated red blood cells.  I saw no target cells.  There were no schistocytes.  White blood cells appeared mature.  There were no hypersegmented polys.  Platelets were slow and increased in number.  Platelets appear small in size.  They were well granulated.  We will end up given her IV iron, I am sure.  How much iron will depend on what her iron levels are.  We spent about 45 minutes with Cassandra Reese.  She is very nice.  She works for [employer].  We had a good time talking about that.  We will likely plan to get her back to see [place] in about 2 months.

## 2019-07-13 LAB — ERYTHROPOIETIN: Erythropoietin: 182.6 m[IU]/mL — ABNORMAL HIGH (ref 2.6–18.5)

## 2019-07-14 ENCOUNTER — Ambulatory Visit: Payer: BLUE CROSS/BLUE SHIELD | Admitting: Family

## 2019-07-14 ENCOUNTER — Inpatient Hospital Stay: Payer: BLUE CROSS/BLUE SHIELD

## 2019-07-14 LAB — HGB FRACTIONATION CASCADE
Hgb A2: 1.8 % (ref 1.8–3.2)
Hgb A: 98.2 % (ref 96.4–98.8)
Hgb F: 0 % (ref 0.0–2.0)
Hgb S: 0 %

## 2019-07-19 ENCOUNTER — Inpatient Hospital Stay: Payer: BC Managed Care – PPO

## 2019-07-19 ENCOUNTER — Other Ambulatory Visit: Payer: Self-pay

## 2019-07-19 VITALS — BP 134/80 | HR 67 | Temp 97.0°F | Resp 18

## 2019-07-19 DIAGNOSIS — D500 Iron deficiency anemia secondary to blood loss (chronic): Secondary | ICD-10-CM | POA: Diagnosis not present

## 2019-07-19 DIAGNOSIS — N921 Excessive and frequent menstruation with irregular cycle: Secondary | ICD-10-CM

## 2019-07-19 MED ORDER — SODIUM CHLORIDE 0.9 % IV SOLN
Freq: Once | INTRAVENOUS | Status: AC
  Filled 2019-07-19: qty 250

## 2019-07-19 MED ORDER — SODIUM CHLORIDE 0.9 % IV SOLN
200.0000 mg | Freq: Once | INTRAVENOUS | Status: AC
  Administered 2019-07-19: 200 mg via INTRAVENOUS
  Filled 2019-07-19: qty 200

## 2019-07-19 NOTE — Patient Instructions (Signed)

## 2019-07-21 DIAGNOSIS — D259 Leiomyoma of uterus, unspecified: Secondary | ICD-10-CM | POA: Insufficient documentation

## 2019-07-21 LAB — ALPHA-THALASSEMIA GENOTYPR

## 2019-07-26 ENCOUNTER — Other Ambulatory Visit: Payer: Self-pay

## 2019-07-26 ENCOUNTER — Inpatient Hospital Stay: Payer: BC Managed Care – PPO

## 2019-07-26 VITALS — BP 147/90 | HR 69 | Temp 97.3°F | Resp 18

## 2019-07-26 DIAGNOSIS — D500 Iron deficiency anemia secondary to blood loss (chronic): Secondary | ICD-10-CM

## 2019-07-26 DIAGNOSIS — N921 Excessive and frequent menstruation with irregular cycle: Secondary | ICD-10-CM

## 2019-07-26 MED ORDER — SODIUM CHLORIDE 0.9 % IV SOLN
Freq: Once | INTRAVENOUS | Status: AC
  Filled 2019-07-26: qty 250

## 2019-07-26 MED ORDER — SODIUM CHLORIDE 0.9 % IV SOLN
200.0000 mg | Freq: Once | INTRAVENOUS | Status: AC
  Administered 2019-07-26: 200 mg via INTRAVENOUS
  Filled 2019-07-26: qty 10

## 2019-07-26 NOTE — Patient Instructions (Signed)

## 2019-08-02 ENCOUNTER — Other Ambulatory Visit: Payer: Self-pay

## 2019-08-02 ENCOUNTER — Inpatient Hospital Stay: Payer: BC Managed Care – PPO

## 2019-08-02 VITALS — BP 140/87 | HR 70 | Temp 97.5°F | Resp 18

## 2019-08-02 DIAGNOSIS — N921 Excessive and frequent menstruation with irregular cycle: Secondary | ICD-10-CM

## 2019-08-02 DIAGNOSIS — D500 Iron deficiency anemia secondary to blood loss (chronic): Secondary | ICD-10-CM

## 2019-08-02 MED ORDER — SODIUM CHLORIDE 0.9 % IV SOLN
200.0000 mg | Freq: Once | INTRAVENOUS | Status: AC
  Administered 2019-08-02: 200 mg via INTRAVENOUS
  Filled 2019-08-02: qty 200

## 2019-08-02 MED ORDER — SODIUM CHLORIDE 0.9 % IV SOLN
Freq: Once | INTRAVENOUS | Status: AC
  Filled 2019-08-02: qty 250

## 2019-08-02 NOTE — Patient Instructions (Signed)

## 2019-08-06 ENCOUNTER — Other Ambulatory Visit: Payer: Self-pay

## 2019-08-06 ENCOUNTER — Inpatient Hospital Stay: Payer: BC Managed Care – PPO

## 2019-08-06 VITALS — BP 126/49 | HR 75 | Temp 98.1°F | Resp 18

## 2019-08-06 DIAGNOSIS — D500 Iron deficiency anemia secondary to blood loss (chronic): Secondary | ICD-10-CM | POA: Diagnosis not present

## 2019-08-06 DIAGNOSIS — N921 Excessive and frequent menstruation with irregular cycle: Secondary | ICD-10-CM

## 2019-08-06 MED ORDER — SODIUM CHLORIDE 0.9 % IV SOLN
Freq: Once | INTRAVENOUS | Status: AC
  Filled 2019-08-06: qty 250

## 2019-08-06 MED ORDER — SODIUM CHLORIDE 0.9 % IV SOLN
200.0000 mg | Freq: Once | INTRAVENOUS | Status: AC
  Administered 2019-08-06: 200 mg via INTRAVENOUS
  Filled 2019-08-06: qty 200

## 2019-08-06 NOTE — Patient Instructions (Signed)

## 2019-08-10 ENCOUNTER — Ambulatory Visit: Payer: BC Managed Care – PPO

## 2019-08-11 ENCOUNTER — Inpatient Hospital Stay: Payer: BC Managed Care – PPO | Attending: Family

## 2019-08-11 ENCOUNTER — Other Ambulatory Visit: Payer: Self-pay

## 2019-08-11 VITALS — BP 123/47 | HR 67 | Temp 97.8°F | Resp 18

## 2019-08-11 DIAGNOSIS — D500 Iron deficiency anemia secondary to blood loss (chronic): Secondary | ICD-10-CM

## 2019-08-11 DIAGNOSIS — N921 Excessive and frequent menstruation with irregular cycle: Secondary | ICD-10-CM | POA: Insufficient documentation

## 2019-08-11 MED ORDER — SODIUM CHLORIDE 0.9 % IV SOLN
Freq: Once | INTRAVENOUS | Status: AC
  Filled 2019-08-11: qty 250

## 2019-08-11 MED ORDER — SODIUM CHLORIDE 0.9 % IV SOLN
200.0000 mg | Freq: Once | INTRAVENOUS | Status: AC
  Administered 2019-08-11: 200 mg via INTRAVENOUS
  Filled 2019-08-11: qty 200

## 2019-08-11 NOTE — Patient Instructions (Signed)

## 2019-08-16 ENCOUNTER — Ambulatory Visit: Payer: BC Managed Care – PPO

## 2019-08-25 ENCOUNTER — Telehealth: Payer: Self-pay | Admitting: Family

## 2019-08-25 ENCOUNTER — Inpatient Hospital Stay (HOSPITAL_BASED_OUTPATIENT_CLINIC_OR_DEPARTMENT_OTHER): Payer: BC Managed Care – PPO | Admitting: Family

## 2019-08-25 ENCOUNTER — Other Ambulatory Visit: Payer: Self-pay

## 2019-08-25 ENCOUNTER — Inpatient Hospital Stay: Payer: BC Managed Care – PPO

## 2019-08-25 ENCOUNTER — Other Ambulatory Visit: Payer: Self-pay | Admitting: *Deleted

## 2019-08-25 ENCOUNTER — Encounter: Payer: Self-pay | Admitting: Family

## 2019-08-25 VITALS — BP 152/90 | HR 55 | Temp 97.9°F | Resp 18 | Wt 351.8 lb

## 2019-08-25 DIAGNOSIS — D649 Anemia, unspecified: Secondary | ICD-10-CM

## 2019-08-25 DIAGNOSIS — N921 Excessive and frequent menstruation with irregular cycle: Secondary | ICD-10-CM | POA: Diagnosis not present

## 2019-08-25 DIAGNOSIS — D500 Iron deficiency anemia secondary to blood loss (chronic): Secondary | ICD-10-CM

## 2019-08-25 LAB — CBC WITH DIFFERENTIAL (CANCER CENTER ONLY)
Abs Immature Granulocytes: 0.02 10*3/uL (ref 0.00–0.07)
Basophils Absolute: 0 10*3/uL (ref 0.0–0.1)
Basophils Relative: 0 %
Eosinophils Absolute: 0.1 10*3/uL (ref 0.0–0.5)
Eosinophils Relative: 1 %
HCT: 34.8 % — ABNORMAL LOW (ref 36.0–46.0)
Hemoglobin: 9.6 g/dL — ABNORMAL LOW (ref 12.0–15.0)
Immature Granulocytes: 0 %
Lymphocytes Relative: 32 %
Lymphs Abs: 2 10*3/uL (ref 0.7–4.0)
MCH: 19.2 pg — ABNORMAL LOW (ref 26.0–34.0)
MCHC: 27.6 g/dL — ABNORMAL LOW (ref 30.0–36.0)
MCV: 69.5 fL — ABNORMAL LOW (ref 80.0–100.0)
Monocytes Absolute: 0.3 10*3/uL (ref 0.1–1.0)
Monocytes Relative: 5 %
Neutro Abs: 3.9 10*3/uL (ref 1.7–7.7)
Neutrophils Relative %: 62 %
Platelet Count: 363 10*3/uL (ref 150–400)
RBC: 5.01 MIL/uL (ref 3.87–5.11)
RDW: 26.7 % — ABNORMAL HIGH (ref 11.5–15.5)
WBC Count: 6.4 10*3/uL (ref 4.0–10.5)
nRBC: 0 % (ref 0.0–0.2)

## 2019-08-25 LAB — CMP (CANCER CENTER ONLY)
ALT: 7 U/L (ref 0–44)
AST: 9 U/L — ABNORMAL LOW (ref 15–41)
Albumin: 3.5 g/dL (ref 3.5–5.0)
Alkaline Phosphatase: 65 U/L (ref 38–126)
Anion gap: 8 (ref 5–15)
BUN: 6 mg/dL (ref 6–20)
CO2: 29 mmol/L (ref 22–32)
Calcium: 9 mg/dL (ref 8.9–10.3)
Chloride: 106 mmol/L (ref 98–111)
Creatinine: 0.74 mg/dL (ref 0.44–1.00)
GFR, Est AFR Am: >60 mL/min (ref >60)
GFR, Estimated: >60 mL/min (ref >60)
Glucose, Bld: 129 mg/dL — ABNORMAL HIGH (ref 70–99)
Potassium: 3.8 mmol/L (ref 3.5–5.1)
Sodium: 143 mmol/L (ref 135–145)
Total Bilirubin: 0.4 mg/dL (ref 0.3–1.2)
Total Protein: 6.6 g/dL (ref 6.5–8.1)

## 2019-08-25 LAB — IRON AND TIBC
Iron: 27 ug/dL — ABNORMAL LOW (ref 41–142)
Saturation Ratios: 9 % — ABNORMAL LOW (ref 21–57)
TIBC: 305 ug/dL (ref 236–444)
UIBC: 278 ug/dL (ref 120–384)

## 2019-08-25 LAB — FERRITIN: Ferritin: 55 ng/mL (ref 11–307)

## 2019-08-25 NOTE — Progress Notes (Signed)
Hematology and Oncology Follow Up Visit  Cassandra Reese [id number] Jul 17, 1990 29 y.o. 08/25/2019   Principle Diagnosis:  Iron deficiency anemia secondary to heavy cycles - fibroids  Current Therapy: IV iron as indicated    Interim History:  Cassandra Reese is here today for follow-up. She is doing fairly well but had a very heavy, painful period last week. She has discussed this with her gynecologist and they are working on a plan to treat her more effectively. She has Lysteda to take when she starts each cycle but this does not sound like it has been effective.  She had a transvaginal US last month which showed uterine fibroids.  She has not noted any other blood loss.  She still has some mild fatigue at times.  No fever, chills, n/v, cough, rash, dizziness, SOB, chest pain, palpitations, abdominal pain or changes in bowel or bladder habits.  She is back to walking and going to the gym to exercise.  No swelling, tenderness, numbness or tingling in her extremities.  No falls or syncope.  She has maintained a good appetite and is staying well hydrated. Her weight is stable.   ECOG Performance Status: 1 - Symptomatic but completely ambulatory  Medications:  Allergies as of 08/25/2019      Reactions   Dust Mite Extract    Molds & Smuts       Medication List       Accurate as of August 25, 2019  9:07 AM. If you have any questions, ask your nurse or doctor.        diphenhydrAMINE 50 MG tablet Commonly known as: BENADRYL Take 1 tablet (50 mg total) by mouth every 6 (six) hours. Until swelling resolves   PreviDent 5000 Booster Plus 1.1 % Pste Generic drug: Sodium Fluoride 2 (two) times daily.   tranexamic acid 650 MG Tabs tablet Commonly known as: LYSTEDA Take 1,300 mg by mouth 3 (three) times daily.       Allergies:  Allergies  Allergen Reactions  . Dust Mite Extract   . Molds & Smuts     Past Medical History, Surgical history, Social history, and Family History were  reviewed and updated.  Review of Systems: All other 10 point review of systems is negative.   Physical Exam:  vitals were not taken for this visit.   Wt Readings from Last 3 Encounters:  07/12/19 (!) 355 lb (161 kg)  12/10/17 (!) 369 lb 12.8 oz (167.7 kg)  11/24/17 (!) 361 lb 6.4 oz (163.9 kg)    Ocular: Sclerae unicteric, pupils equal, round and reactive to light Ear-nose-throat: Oropharynx clear, dentition fair Lymphatic: No cervical or supraclavicular adenopathy Lungs no rales or rhonchi, good excursion bilaterally Heart regular rate and rhythm, no murmur appreciated Abd soft, nontender, positive bowel sounds, no liver or spleen tip palpated on exam, no fluid wave  MSK no focal spinal tenderness, no joint edema Neuro: non-focal, well-oriented, appropriate affect Breasts: Deferred   Lab Results  Component Value Date   WBC 6.4 08/25/2019   HGB 9.6 (L) 08/25/2019   HCT 34.8 (L) 08/25/2019   MCV 69.5 (L) 08/25/2019   PLT 363 08/25/2019   Lab Results  Component Value Date   FERRITIN 7 (L) 07/12/2019   IRON 7 (L) 07/12/2019   TIBC 359 07/12/2019   UIBC 352 07/12/2019   IRONPCTSAT 2 (L) 07/12/2019   Lab Results  Component Value Date   RETICCTPCT 1.2 07/12/2019   RBC 5.01 08/25/2019   No results found  for: KPAFRELGTCHN, LAMBDASER, KAPLAMBRATIO No results found for: IGGSERUM, IGA, IGMSERUM No results found for: Odetta Pink, SPEI   Chemistry      Component Value Date/Time   NA 139 07/12/2019 0939   K 3.9 07/12/2019 0939   CL 103 07/12/2019 0939   CO2 29 07/12/2019 0939   BUN 7 07/12/2019 0939   CREATININE 0.69 07/12/2019 0939      Component Value Date/Time   CALCIUM 9.1 07/12/2019 0939   ALKPHOS 61 07/12/2019 0939   AST 10 (L) 07/12/2019 0939   ALT 7 07/12/2019 0939   BILITOT 0.4 07/12/2019 0939       Impression and Plan: Cassandra Reese is a very pleasant 29 yo African American female with history of iron  deficiency anemia secondary to heavy cycles, fibroids. Hgb is improved at 9.6 and she is feeling some better.  She is still working with her gynecologist to slow her cycle.  We will see what her iron studies look like and replace if needed.  We will plan to see her again in another 8 weeks.  She will contact our office with any questions or concerns. We can certainly see her sooner if needed.    Laverna Peace, NP 6/16/20219:07 AM

## 2019-08-25 NOTE — Telephone Encounter (Signed)
Appointments scheduled calendar printed & mailed per 6/16 los 

## 2019-08-27 ENCOUNTER — Telehealth: Payer: Self-pay | Admitting: Family

## 2019-08-27 NOTE — Telephone Encounter (Signed)
Called and spoke with patient regarding iron infusion appointments that have been added.  She has requested early AM appointments. Per 6/18 sch msg

## 2019-08-30 ENCOUNTER — Other Ambulatory Visit: Payer: Self-pay

## 2019-08-30 ENCOUNTER — Inpatient Hospital Stay: Payer: BC Managed Care – PPO

## 2019-08-30 VITALS — BP 129/80 | HR 66 | Temp 97.5°F | Resp 17

## 2019-08-30 DIAGNOSIS — D500 Iron deficiency anemia secondary to blood loss (chronic): Secondary | ICD-10-CM

## 2019-08-30 DIAGNOSIS — N921 Excessive and frequent menstruation with irregular cycle: Secondary | ICD-10-CM

## 2019-08-30 MED ORDER — SODIUM CHLORIDE 0.9% FLUSH
10.0000 mL | Freq: Once | INTRAVENOUS | Status: DC | PRN
  Filled 2019-08-30: qty 10

## 2019-08-30 MED ORDER — HEPARIN SOD (PORK) LOCK FLUSH 100 UNIT/ML IV SOLN
250.0000 [IU] | Freq: Once | INTRAVENOUS | Status: DC | PRN
  Filled 2019-08-30: qty 5

## 2019-08-30 MED ORDER — SODIUM CHLORIDE 0.9% FLUSH
3.0000 mL | Freq: Once | INTRAVENOUS | Status: DC | PRN
  Filled 2019-08-30: qty 10

## 2019-08-30 MED ORDER — SODIUM CHLORIDE 0.9 % IV SOLN
200.0000 mg | Freq: Once | INTRAVENOUS | Status: AC
  Administered 2019-08-30: 200 mg via INTRAVENOUS
  Filled 2019-08-30: qty 200

## 2019-08-30 MED ORDER — SODIUM CHLORIDE 0.9 % IV SOLN
INTRAVENOUS | Status: DC
  Filled 2019-08-30: qty 250

## 2019-08-30 NOTE — Patient Instructions (Signed)

## 2019-08-31 ENCOUNTER — Inpatient Hospital Stay: Payer: BC Managed Care – PPO

## 2019-08-31 VITALS — BP 123/69 | HR 68 | Temp 98.2°F | Resp 16

## 2019-08-31 DIAGNOSIS — D500 Iron deficiency anemia secondary to blood loss (chronic): Secondary | ICD-10-CM | POA: Diagnosis not present

## 2019-08-31 DIAGNOSIS — N921 Excessive and frequent menstruation with irregular cycle: Secondary | ICD-10-CM

## 2019-08-31 MED ORDER — SODIUM CHLORIDE 0.9 % IV SOLN
200.0000 mg | Freq: Once | INTRAVENOUS | Status: AC
  Administered 2019-08-31: 200 mg via INTRAVENOUS
  Filled 2019-08-31: qty 200

## 2019-08-31 MED ORDER — SODIUM CHLORIDE 0.9 % IV SOLN
INTRAVENOUS | Status: DC
  Filled 2019-08-31: qty 250

## 2019-08-31 NOTE — Patient Instructions (Signed)

## 2019-09-06 ENCOUNTER — Other Ambulatory Visit: Payer: Self-pay

## 2019-09-06 ENCOUNTER — Inpatient Hospital Stay: Payer: BC Managed Care – PPO

## 2019-09-06 VITALS — BP 139/85 | HR 59 | Temp 97.3°F | Resp 17

## 2019-09-06 DIAGNOSIS — D500 Iron deficiency anemia secondary to blood loss (chronic): Secondary | ICD-10-CM

## 2019-09-06 DIAGNOSIS — N921 Excessive and frequent menstruation with irregular cycle: Secondary | ICD-10-CM

## 2019-09-06 MED ORDER — SODIUM CHLORIDE 0.9 % IV SOLN
INTRAVENOUS | Status: DC
  Filled 2019-09-06: qty 250

## 2019-09-06 MED ORDER — SODIUM CHLORIDE 0.9 % IV SOLN
200.0000 mg | Freq: Once | INTRAVENOUS | Status: AC
  Administered 2019-09-06: 200 mg via INTRAVENOUS
  Filled 2019-09-06: qty 200

## 2019-09-06 NOTE — Patient Instructions (Signed)

## 2019-09-06 NOTE — Progress Notes (Signed)
Patient declined to stay for the post infusion observation period. Patient denies any difficulty with this infusion in the past and is aware to call with any questions or concerns.   Pt verbalized understanding and had no further questions today.   

## 2019-09-08 ENCOUNTER — Inpatient Hospital Stay: Payer: BC Managed Care – PPO

## 2019-09-10 ENCOUNTER — Other Ambulatory Visit: Payer: BC Managed Care – PPO

## 2019-09-10 ENCOUNTER — Ambulatory Visit: Payer: BC Managed Care – PPO | Admitting: Family

## 2019-09-14 ENCOUNTER — Other Ambulatory Visit: Payer: Self-pay

## 2019-09-14 ENCOUNTER — Inpatient Hospital Stay: Payer: BC Managed Care – PPO | Attending: Family

## 2019-09-14 VITALS — BP 139/88 | HR 60 | Temp 98.1°F | Resp 16

## 2019-09-14 DIAGNOSIS — D259 Leiomyoma of uterus, unspecified: Secondary | ICD-10-CM | POA: Insufficient documentation

## 2019-09-14 DIAGNOSIS — N921 Excessive and frequent menstruation with irregular cycle: Secondary | ICD-10-CM

## 2019-09-14 DIAGNOSIS — D500 Iron deficiency anemia secondary to blood loss (chronic): Secondary | ICD-10-CM | POA: Diagnosis not present

## 2019-09-14 MED ORDER — SODIUM CHLORIDE 0.9 % IV SOLN
INTRAVENOUS | Status: DC
  Filled 2019-09-14: qty 250

## 2019-09-14 MED ORDER — SODIUM CHLORIDE 0.9 % IV SOLN
200.0000 mg | Freq: Once | INTRAVENOUS | Status: AC
  Administered 2019-09-14: 200 mg via INTRAVENOUS
  Filled 2019-09-14: qty 200

## 2019-09-14 NOTE — Patient Instructions (Signed)

## 2019-09-17 ENCOUNTER — Other Ambulatory Visit: Payer: Self-pay

## 2019-09-17 ENCOUNTER — Inpatient Hospital Stay: Payer: BC Managed Care – PPO

## 2019-09-17 VITALS — BP 136/78 | HR 64 | Temp 98.5°F | Resp 16

## 2019-09-17 DIAGNOSIS — N921 Excessive and frequent menstruation with irregular cycle: Secondary | ICD-10-CM

## 2019-09-17 DIAGNOSIS — D500 Iron deficiency anemia secondary to blood loss (chronic): Secondary | ICD-10-CM | POA: Diagnosis not present

## 2019-09-17 MED ORDER — SODIUM CHLORIDE 0.9 % IV SOLN
200.0000 mg | Freq: Once | INTRAVENOUS | Status: AC
  Administered 2019-09-17: 200 mg via INTRAVENOUS
  Filled 2019-09-17: qty 200

## 2019-09-17 MED ORDER — SODIUM CHLORIDE 0.9 % IV SOLN
INTRAVENOUS | Status: DC
  Filled 2019-09-17: qty 250

## 2019-09-17 NOTE — Patient Instructions (Signed)

## 2019-10-25 ENCOUNTER — Telehealth: Payer: Self-pay | Admitting: Family

## 2019-10-25 ENCOUNTER — Inpatient Hospital Stay: Payer: BC Managed Care – PPO | Attending: Family

## 2019-10-25 ENCOUNTER — Other Ambulatory Visit: Payer: Self-pay

## 2019-10-25 ENCOUNTER — Encounter: Payer: Self-pay | Admitting: Family

## 2019-10-25 ENCOUNTER — Inpatient Hospital Stay (HOSPITAL_BASED_OUTPATIENT_CLINIC_OR_DEPARTMENT_OTHER): Payer: BC Managed Care – PPO | Admitting: Family

## 2019-10-25 VITALS — BP 142/85 | HR 55 | Temp 98.3°F | Resp 18 | Ht 69.0 in | Wt 352.0 lb

## 2019-10-25 DIAGNOSIS — N921 Excessive and frequent menstruation with irregular cycle: Secondary | ICD-10-CM | POA: Insufficient documentation

## 2019-10-25 DIAGNOSIS — D500 Iron deficiency anemia secondary to blood loss (chronic): Secondary | ICD-10-CM | POA: Diagnosis not present

## 2019-10-25 DIAGNOSIS — D259 Leiomyoma of uterus, unspecified: Secondary | ICD-10-CM | POA: Diagnosis not present

## 2019-10-25 LAB — CBC WITH DIFFERENTIAL (CANCER CENTER ONLY)
Abs Immature Granulocytes: 0.05 10*3/uL (ref 0.00–0.07)
Basophils Absolute: 0 10*3/uL (ref 0.0–0.1)
Basophils Relative: 0 %
Eosinophils Absolute: 0.1 10*3/uL (ref 0.0–0.5)
Eosinophils Relative: 1 %
HCT: 38.3 % (ref 36.0–46.0)
Hemoglobin: 11.5 g/dL — ABNORMAL LOW (ref 12.0–15.0)
Immature Granulocytes: 1 %
Lymphocytes Relative: 40 %
Lymphs Abs: 2.2 10*3/uL (ref 0.7–4.0)
MCH: 23.3 pg — ABNORMAL LOW (ref 26.0–34.0)
MCHC: 30 g/dL (ref 30.0–36.0)
MCV: 77.5 fL — ABNORMAL LOW (ref 80.0–100.0)
Monocytes Absolute: 0.3 10*3/uL (ref 0.1–1.0)
Monocytes Relative: 5 %
Neutro Abs: 2.9 10*3/uL (ref 1.7–7.7)
Neutrophils Relative %: 53 %
Platelet Count: 293 10*3/uL (ref 150–400)
RBC: 4.94 MIL/uL (ref 3.87–5.11)
RDW: 20.4 % — ABNORMAL HIGH (ref 11.5–15.5)
WBC Count: 5.5 10*3/uL (ref 4.0–10.5)
nRBC: 0 % (ref 0.0–0.2)

## 2019-10-25 LAB — FERRITIN: Ferritin: 48 ng/mL (ref 11–307)

## 2019-10-25 LAB — RETICULOCYTES
Immature Retic Fract: 13.2 % (ref 2.3–15.9)
RBC.: 4.89 MIL/uL (ref 3.87–5.11)
Retic Count, Absolute: 61.6 10*3/uL (ref 19.0–186.0)
Retic Ct Pct: 1.3 % (ref 0.4–3.1)

## 2019-10-25 LAB — IRON AND TIBC
Iron: 33 ug/dL — ABNORMAL LOW (ref 41–142)
Saturation Ratios: 11 % — ABNORMAL LOW (ref 21–57)
TIBC: 300 ug/dL (ref 236–444)
UIBC: 267 ug/dL (ref 120–384)

## 2019-10-25 NOTE — Progress Notes (Signed)
Hematology and Oncology Follow Up Visit  Cassandra Reese [id number] 01-29-1991 29 y.o. 10/25/2019   Principle Diagnosis:  Iron deficiency anemia secondary to heavy cycles - fibroids  Current Therapy: IV iron as indicated    Interim History:  Cassandra Reese is here today for follow-up. She is doing well and has no complaints at this time.  She is feeling better after receiving Iv iron. Hgb is now 11.5, MCV 77.5.  Her most recent cycle ended this past Thursday and was much better with the Lysteda. She states that it was heavy but not as heavy as before.  She has not noted any other blood loss. No bruising or petechiae.  No fever, chills, n/v, cough, rash, dizziness, SOB, chest pain, palpitations, abdominal pain or changes in bowel or bladder habits.  No swelling, tenderness, numbness or tingling in her extremities at this time.  No falls or syncope.  She has maintained a good appetite and is staying well hydrated. Her weight is stable.  She is exercising regularly at [employer].   ECOG Performance Status: 1 - Symptomatic but completely ambulatory  Medications:  Allergies as of 10/25/2019      Reactions   Dust Mite Extract    Molds & Smuts       Medication List       Accurate as of October 25, 2019  9:51 AM. If you have any questions, ask your nurse or doctor.        diphenhydrAMINE 50 MG tablet Commonly known as: BENADRYL Take 1 tablet (50 mg total) by mouth every 6 (six) hours. Until swelling resolves   EPINEPHrine 0.3 mg/0.3 mL Soaj injection Commonly known as: EPI-PEN 0.3 mg.   PreviDent 5000 Booster Plus 1.1 % Pste Generic drug: Sodium Fluoride 2 (two) times daily.   tranexamic acid 650 MG Tabs tablet Commonly known as: LYSTEDA Take 1,300 mg by mouth 3 (three) times daily.       Allergies:  Allergies  Allergen Reactions  . Dust Mite Extract   . Molds & Smuts     Past Medical History, Surgical history, Social history, and Family History were reviewed  and updated.  Review of Systems: All other 10 point review of systems is negative.   Physical Exam:  vitals were not taken for this visit.   Wt Readings from Last 3 Encounters:  08/25/19 (!) 351 lb 12.8 oz (159.6 kg)  07/12/19 (!) 355 lb (161 kg)  12/10/17 (!) 369 lb 12.8 oz (167.7 kg)    Ocular: Sclerae unicteric, pupils equal, round and reactive to light Ear-nose-throat: Oropharynx clear, dentition fair Lymphatic: No cervical or supraclavicular adenopathy Lungs no rales or rhonchi, good excursion bilaterally Heart regular rate and rhythm, no murmur appreciated Abd soft, nontender, positive bowel sounds, no liver or spleen tip palpated on exam, no fluid wave  MSK no focal spinal tenderness, no joint edema Neuro: non-focal, well-oriented, appropriate affect Breasts: Deferred   Lab Results  Component Value Date   WBC 5.5 10/25/2019   HGB 11.5 (L) 10/25/2019   HCT 38.3 10/25/2019   MCV 77.5 (L) 10/25/2019   PLT 293 10/25/2019   Lab Results  Component Value Date   FERRITIN 55 08/25/2019   IRON 27 (L) 08/25/2019   TIBC 305 08/25/2019   UIBC 278 08/25/2019   IRONPCTSAT 9 (L) 08/25/2019   Lab Results  Component Value Date   RETICCTPCT 1.3 10/25/2019   RBC 4.94 10/25/2019   No results found for: KPAFRELGTCHN, LAMBDASER, KAPLAMBRATIO No results  found for: IGGSERUM, IGA, IGMSERUM No results found for: Ronnald Ramp, A1GS, A2GS, Tillman Sers, SPEI   Chemistry      Component Value Date/Time   NA 143 08/25/2019 0842   K 3.8 08/25/2019 0842   CL 106 08/25/2019 0842   CO2 29 08/25/2019 0842   BUN 6 08/25/2019 0842   CREATININE 0.74 08/25/2019 0842      Component Value Date/Time   CALCIUM 9.0 08/25/2019 0842   ALKPHOS 65 08/25/2019 0842   AST 9 (L) 08/25/2019 0842   ALT 7 08/25/2019 0842   BILITOT 0.4 08/25/2019 0842       Impression and Plan: Cassandra Reese is a very pleasant 63 yoAfrican American female with history of iron deficiency  anemia secondary to heavy cycles, fibroids. We will see what her iron studies look like and replace if needed.  She will go ahead and start taking folic acid over the counter daily.  We will plan to see her again in 3 months.  She can contact our office with any questions or concerns.   Laverna Peace, NP 8/16/20219:51 AM

## 2019-10-25 NOTE — Telephone Encounter (Signed)
Appointments scheduled calendar printed per 8/16 los 

## 2019-10-27 ENCOUNTER — Telehealth: Payer: Self-pay | Admitting: Family

## 2019-10-27 NOTE — Telephone Encounter (Signed)
Called and LMVM for patient regarding appointments added per 8/17 sch msg

## 2019-10-29 ENCOUNTER — Other Ambulatory Visit: Payer: Self-pay

## 2019-10-29 ENCOUNTER — Inpatient Hospital Stay: Payer: BC Managed Care – PPO

## 2019-10-29 VITALS — BP 121/78 | HR 86 | Temp 99.3°F

## 2019-10-29 DIAGNOSIS — D500 Iron deficiency anemia secondary to blood loss (chronic): Secondary | ICD-10-CM | POA: Diagnosis not present

## 2019-10-29 DIAGNOSIS — N921 Excessive and frequent menstruation with irregular cycle: Secondary | ICD-10-CM

## 2019-10-29 MED ORDER — SODIUM CHLORIDE 0.9 % IV SOLN
200.0000 mg | Freq: Once | INTRAVENOUS | Status: AC
  Administered 2019-10-29: 200 mg via INTRAVENOUS
  Filled 2019-10-29: qty 200

## 2019-10-29 MED ORDER — SODIUM CHLORIDE 0.9 % IV SOLN
INTRAVENOUS | Status: DC
  Filled 2019-10-29: qty 250

## 2019-10-29 NOTE — Patient Instructions (Signed)

## 2019-11-01 ENCOUNTER — Inpatient Hospital Stay: Payer: BC Managed Care – PPO

## 2019-11-01 VITALS — BP 122/74 | HR 62 | Temp 98.8°F | Resp 18

## 2019-11-01 DIAGNOSIS — N921 Excessive and frequent menstruation with irregular cycle: Secondary | ICD-10-CM

## 2019-11-01 DIAGNOSIS — D500 Iron deficiency anemia secondary to blood loss (chronic): Secondary | ICD-10-CM | POA: Diagnosis not present

## 2019-11-01 MED ORDER — SODIUM CHLORIDE 0.9 % IV SOLN
200.0000 mg | Freq: Once | INTRAVENOUS | Status: AC
  Administered 2019-11-01: 200 mg via INTRAVENOUS
  Filled 2019-11-01: qty 200

## 2019-11-01 MED ORDER — SODIUM CHLORIDE 0.9 % IV SOLN
Freq: Once | INTRAVENOUS | Status: AC
  Filled 2019-11-01: qty 250

## 2019-11-01 NOTE — Patient Instructions (Signed)

## 2019-11-03 ENCOUNTER — Ambulatory Visit: Payer: BC Managed Care – PPO

## 2019-11-05 ENCOUNTER — Other Ambulatory Visit: Payer: Self-pay

## 2019-11-05 ENCOUNTER — Ambulatory Visit: Payer: BC Managed Care – PPO

## 2019-11-05 ENCOUNTER — Inpatient Hospital Stay: Payer: BC Managed Care – PPO

## 2019-11-05 VITALS — BP 130/78 | HR 65 | Temp 98.4°F | Resp 17

## 2019-11-05 DIAGNOSIS — D500 Iron deficiency anemia secondary to blood loss (chronic): Secondary | ICD-10-CM | POA: Diagnosis not present

## 2019-11-05 DIAGNOSIS — N921 Excessive and frequent menstruation with irregular cycle: Secondary | ICD-10-CM

## 2019-11-05 MED ORDER — SODIUM CHLORIDE 0.9 % IV SOLN
200.0000 mg | Freq: Once | INTRAVENOUS | Status: AC
  Administered 2019-11-05: 200 mg via INTRAVENOUS
  Filled 2019-11-05: qty 200

## 2019-11-05 MED ORDER — SODIUM CHLORIDE 0.9 % IV SOLN
INTRAVENOUS | Status: DC
  Filled 2019-11-05: qty 250

## 2019-11-05 NOTE — Patient Instructions (Signed)

## 2019-11-06 ENCOUNTER — Other Ambulatory Visit: Payer: Self-pay | Admitting: Family

## 2019-11-08 ENCOUNTER — Ambulatory Visit: Payer: BC Managed Care – PPO

## 2019-12-19 IMAGING — CR DG CHEST 2V
2 series · 2 of 2 positions shown · non-contrast
Comparison: Thoracic spine series of October 25, 2011.

CLINICAL DATA: Preoperative examination prior to gastric sleeve
procedure.

EXAM:
CHEST - 2 VIEW

[w chest pa]
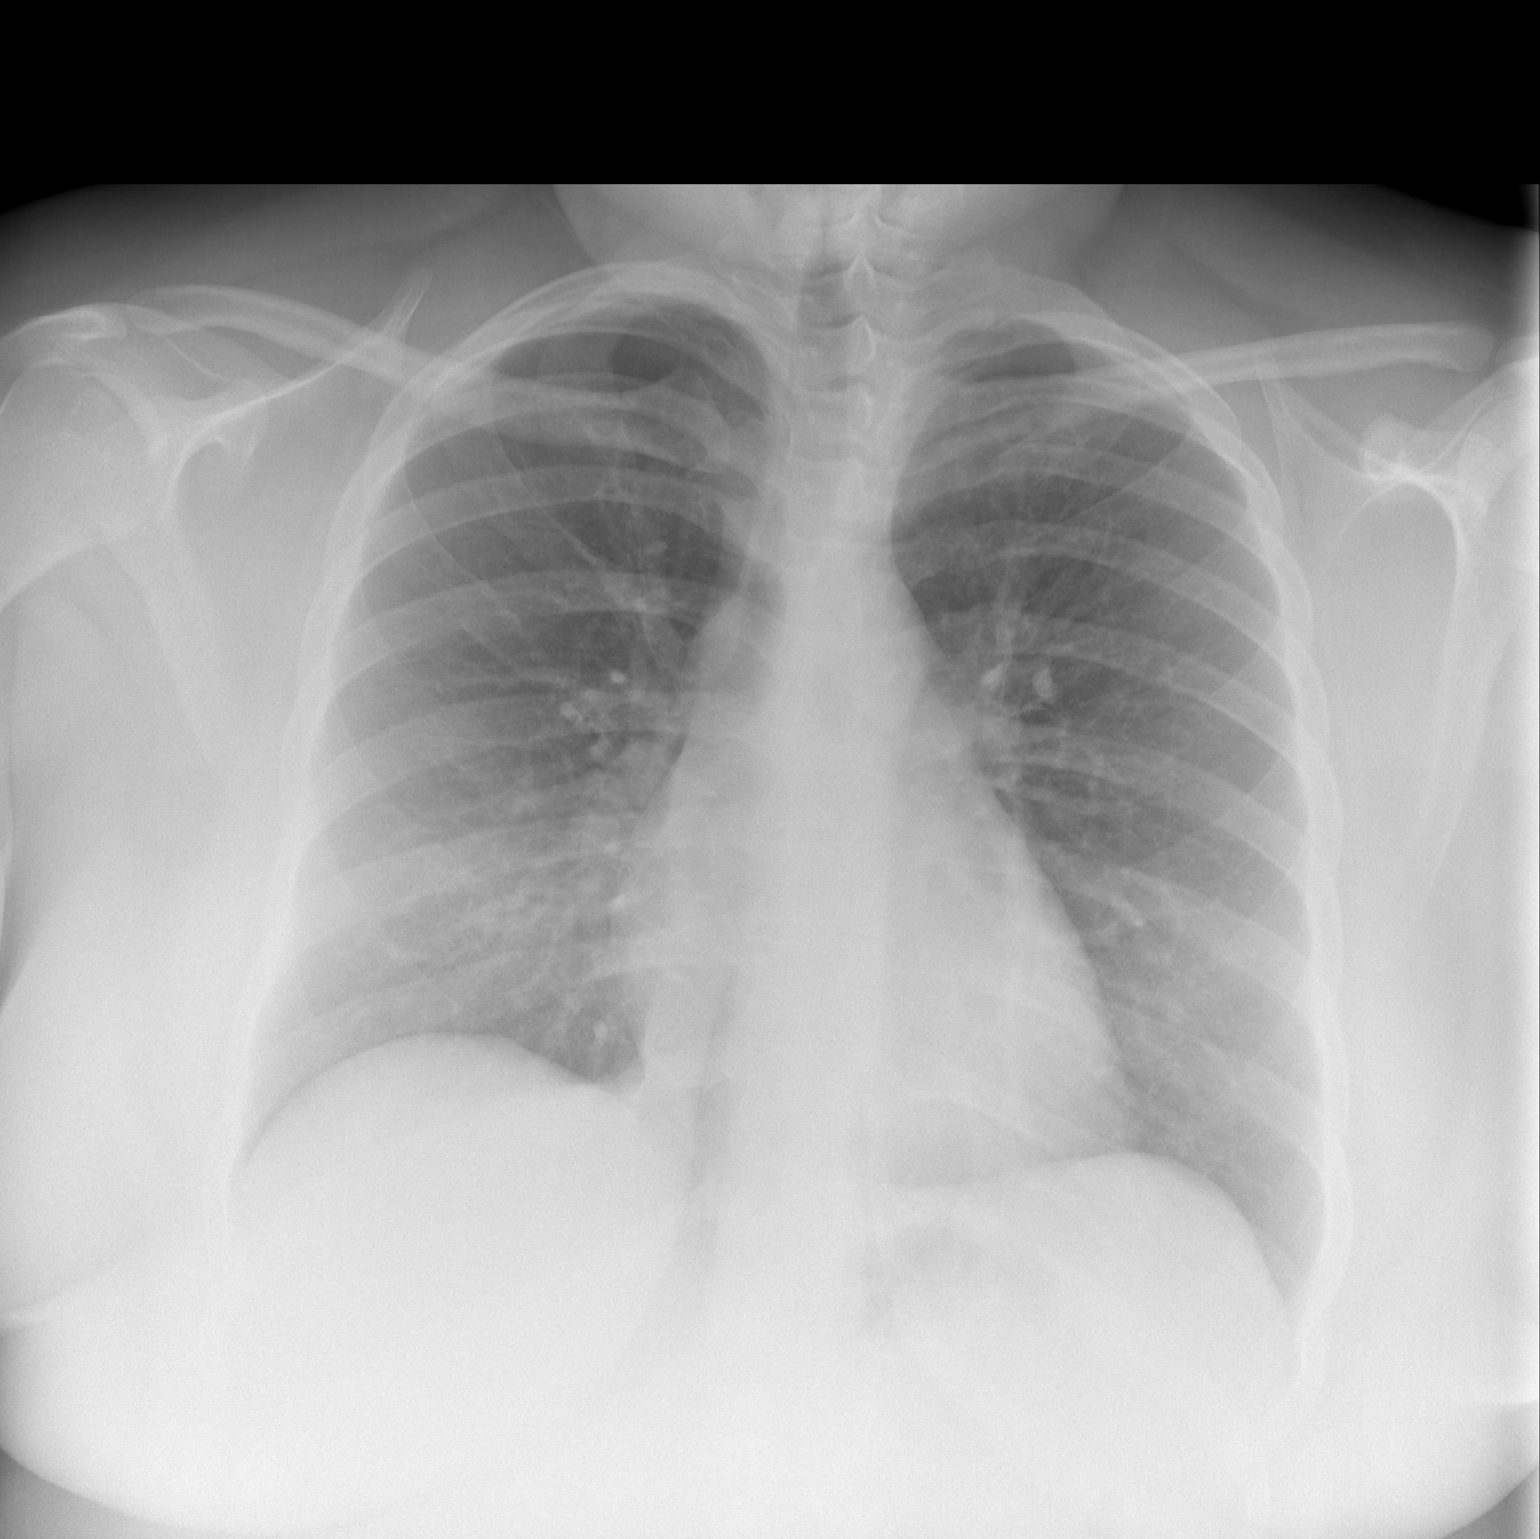

[w chest lat]
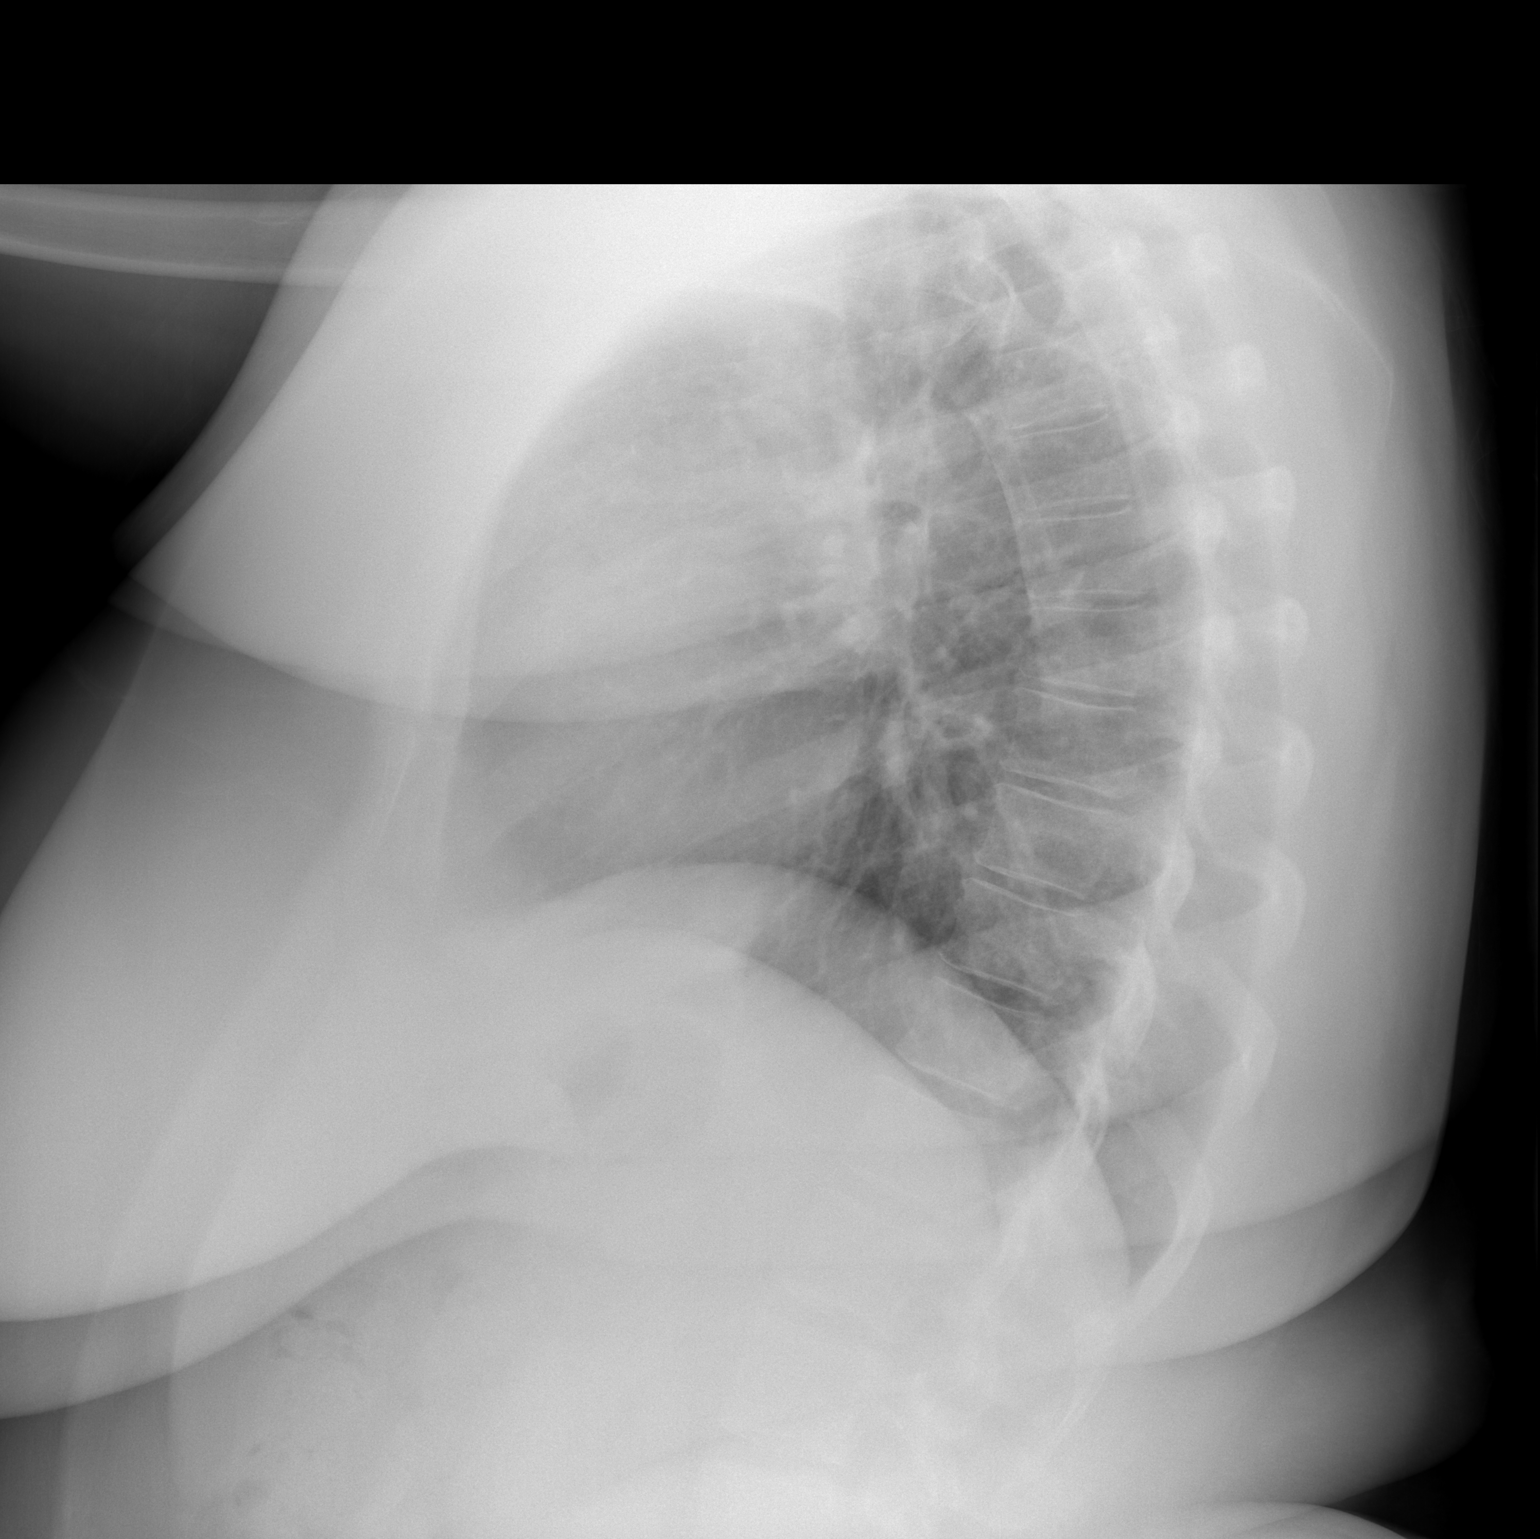

[2 of 2 positions shown; findings below may reference images not displayed]

FINDINGS: The lungs are well-expanded and clear. The heart and pulmonary
vascularity are normal. The mediastinum is normal in width. There is
no pleural effusion. The bony thorax exhibits no acute abnormality.
IMPRESSION: There is no active cardiopulmonary disease.

## 2020-01-25 ENCOUNTER — Inpatient Hospital Stay: Payer: Self-pay | Attending: Family

## 2020-01-25 ENCOUNTER — Inpatient Hospital Stay: Payer: Self-pay | Admitting: Family

## 2021-11-21 ENCOUNTER — Other Ambulatory Visit: Payer: Self-pay

## 2021-11-21 ENCOUNTER — Encounter (HOSPITAL_BASED_OUTPATIENT_CLINIC_OR_DEPARTMENT_OTHER): Payer: Self-pay | Admitting: Obstetrics & Gynecology

## 2021-11-21 NOTE — Progress Notes (Signed)
Spoke w/ via phone for pre-op interview---[facility] Lab needs dos----urine pregnancy per anesthesia, surgeon orders pending as of 11/21/21               Lab results------12/05/21 lab appt for cbc, type & screen COVID test -----patient states asymptomatic no test needed Arrive at -------0530 on Monday, 12/10/21 NPO after MN NO Solid Food.  Clear liquids from MN until---0430 Med rec completed Medications to take morning of surgery -----None Diabetic medication -----n/a Patient instructed no nail polish to be worn day of surgery Patient instructed to bring photo id and insurance card day of surgery Patient aware to have Driver (ride ) / caregiver    for 24 hours after surgery - twin sister, Dawn Patient Special Instructions -----Extended / overnight stay instructions given. Pre-Op special Istructions -----none Patient verbalized understanding of instructions that were given at this phone interview. Patient denies shortness of breath, chest pain, fever, cough at this phone interview.

## 2021-11-21 NOTE — Progress Notes (Signed)
Your procedure is scheduled on Monday, 12/10/2021.  Report to [place] AT  5:30 AM.  Call this number if you have problems the morning of surgery  :[phone].   OUR ADDRESS IS [address].  WE ARE LOCATED IN [facility].  PLEASE BRING YOUR INSURANCE CARD AND PHOTO ID DAY OF SURGERY.  ONLY 2 PEOPLE ARE ALLOWED IN  WAITING  ROOM.                                      REMEMBER:  DO NOT EAT FOOD, CANDY GUM OR MINTS  AFTER MIDNIGHT THE NIGHT BEFORE YOUR SURGERY . YOU MAY HAVE CLEAR LIQUIDS FROM MIDNIGHT THE NIGHT BEFORE YOUR SURGERY UNTIL  4:30 AM. NO CLEAR LIQUIDS AFTER   4:30 AM DAY OF SURGERY.  YOU MAY  BRUSH YOUR TEETH MORNING OF SURGERY AND RINSE YOUR MOUTH OUT, NO CHEWING GUM CANDY OR MINTS.     CLEAR LIQUID DIET   Foods Allowed                                                                     Foods Excluded  Coffee and tea, regular and decaf                             liquids that you cannot  Plain Jell-O                                                                   see through such as: Fruit ices (not with fruit pulp)                                     milk, soups, orange juice  Plain  Popsicles                                    All solid food Carbonated beverages, regular and diet                                    Cranberry, grape and apple juices Sports drinks like Gatorade _____________________________________________________________________     TAKE THESE MEDICATIONS MORNING OF SURGERY: NONE    UP TO 4 VISITORS  MAY VISIT IN THE EXTENDED RECOVERY ROOM UNTIL 800 PM ONLY.  ONE  VISITOR AGE 30 AND OVER MAY SPEND THE NIGHT AND MUST BE IN EXTENDED RECOVERY ROOM NO LATER THAN 800 PM . YOUR DISCHARGE TIME AFTER YOU SPEND THE NIGHT IS 900 AM THE MORNING AFTER YOUR SURGERY.  YOU MAY PACK A SMALL OVERNIGHT BAG WITH TOILETRIES FOR YOUR OVERNIGHT STAY IF YOU WISH.  YOUR PRESCRIPTION MEDICATIONS WILL BE PROVIDED DURING  [place].                                      DO NOT WEAR JEWERLY, MAKE UP. DO NOT WEAR LOTIONS, POWDERS, PERFUMES OR NAIL POLISH ON YOUR FINGERNAILS. TOENAIL POLISH IS OK TO WEAR. DO NOT SHAVE FOR 48 HOURS PRIOR TO DAY OF SURGERY. MEN MAY SHAVE FACE AND NECK. CONTACTS, GLASSES, OR DENTURES MAY NOT BE WORN TO SURGERY.  REMEMBER: NO SMOKING, DRUGS OR ALCOHOL FOR 24 HOURS BEFORE YOUR SURGERY.                                    [facility] IS NOT RESPONSIBLE  FOR ANY BELONGINGS.                                                                    Marland Kitchen           [facility] - Preparing for Surgery Before surgery, you can play an important role.  Because skin is not sterile, your skin needs to be as free of germs as possible.  You can reduce the number of germs on your skin by washing with CHG (chlorahexidine gluconate) soap before surgery.  CHG is an antiseptic cleaner which kills germs and bonds with the skin to continue killing germs even after washing. Please DO NOT use if you have an allergy to CHG or antibacterial soaps.  If your skin becomes reddened/irritated stop using the CHG and inform your nurse when you arrive at Short Stay. Do not shave (including legs and underarms) for at least 48 hours prior to the first CHG shower.  You may shave your face/neck. Please follow these instructions carefully:  1.  Shower with CHG Soap the night before surgery and the  morning of Surgery.  2.  If you choose to wash your hair, wash your hair first as usual with your  normal  shampoo.  3.  After you shampoo, rinse your hair and body thoroughly to remove the  shampoo.                            4.  Use CHG as you would any other liquid soap.  You can apply chg directly  to the skin and wash , please wash your belly button thoroughly with chg soap provided night before and morning of your surgery.                     Gently with a scrungie or clean washcloth.  5.  Apply the CHG Soap to your body  ONLY FROM THE NECK DOWN.   Do not use on face/ open                           Wound or open sores. Avoid contact with eyes, ears mouth and genitals (private parts).  Wash face,  Genitals (private parts) with your normal soap.             6.  Wash thoroughly, paying special attention to the area where your surgery  will be performed.  7.  Thoroughly rinse your body with warm water from the neck down.  8.  DO NOT shower/wash with your normal soap after using and rinsing off  the CHG Soap.                9.  Pat yourself dry with a clean towel.            10.  Wear clean pajamas.            11.  Place clean sheets on your bed the night of your first shower and do not  sleep with pets. Day of Surgery : Do not apply any lotions/deodorants the morning of surgery.  Please wear clean clothes to the hospital/surgery center.  IF YOU HAVE ANY SKIN IRRITATION OR PROBLEMS WITH THE SURGICAL SOAP, PLEASE GET A BAR OF GOLD DIAL SOAP AND SHOWER THE NIGHT BEFORE YOUR SURGERY AND THE MORNING OF YOUR SURGERY. PLEASE LET THE NURSE KNOW MORNING OF YOUR SURGERY IF YOU HAD ANY PROBLEMS WITH THE SURGICAL SOAP.   ________________________________________________________________________                                                        QUESTIONS [facility] PRE OP NURSE PHONE [phone].

## 2021-12-03 ENCOUNTER — Encounter: Payer: Self-pay | Admitting: Family

## 2021-12-05 ENCOUNTER — Encounter (HOSPITAL_COMMUNITY)
Admission: RE | Admit: 2021-12-05 | Discharge: 2021-12-05 | Disposition: A | Discharge status: Home / Self Care | Payer: BC Managed Care – PPO | Source: Ambulatory Visit | Attending: Obstetrics & Gynecology | Admitting: Obstetrics & Gynecology

## 2021-12-05 DIAGNOSIS — Z01818 Encounter for other preprocedural examination: Secondary | ICD-10-CM | POA: Diagnosis not present

## 2021-12-05 DIAGNOSIS — D251 Intramural leiomyoma of uterus: Secondary | ICD-10-CM | POA: Insufficient documentation

## 2021-12-05 LAB — COMPREHENSIVE METABOLIC PANEL
ALT: 12 U/L (ref 0–44)
AST: 18 U/L (ref 15–41)
Albumin: 3.4 g/dL — ABNORMAL LOW (ref 3.5–5.0)
Alkaline Phosphatase: 61 U/L (ref 38–126)
Anion gap: 5 (ref 5–15)
BUN: 11 mg/dL (ref 6–20)
CO2: 26 mmol/L (ref 22–32)
Calcium: 8.9 mg/dL (ref 8.9–10.3)
Chloride: 108 mmol/L (ref 98–111)
Creatinine, Ser: 0.84 mg/dL (ref 0.44–1.00)
GFR, Estimated: >60 mL/min (ref >60)
Glucose, Bld: 85 mg/dL (ref 70–99)
Potassium: 3.8 mmol/L (ref 3.5–5.1)
Sodium: 139 mmol/L (ref 135–145)
Total Bilirubin: 0.4 mg/dL (ref 0.3–1.2)
Total Protein: 7.1 g/dL (ref 6.5–8.1)

## 2021-12-05 LAB — CBC
HCT: 36.6 % (ref 36.0–46.0)
Hemoglobin: 11 g/dL — ABNORMAL LOW (ref 12.0–15.0)
MCH: 24.3 pg — ABNORMAL LOW (ref 26.0–34.0)
MCHC: 30.1 g/dL (ref 30.0–36.0)
MCV: 80.8 fL (ref 80.0–100.0)
Platelets: 318 10*3/uL (ref 150–400)
RBC: 4.53 MIL/uL (ref 3.87–5.11)
RDW: 16.8 % — ABNORMAL HIGH (ref 11.5–15.5)
WBC: 6.3 10*3/uL (ref 4.0–10.5)
nRBC: 0 % (ref 0.0–0.2)

## 2021-12-05 NOTE — H&P (Signed)
Cassandra Reese is an 31 y.o. female G78 with symptomatic uterine leiomyomata.  Patient c/o heavy and painful menses.  Patient has anemia secondary to heavy periods and takes iron supplements and TXA.  She has declined hormonal management.  Patient is nulliparous and would like to maintain childbearing potential.  Last u/s on 10/15/21 showed uterine volume 532 cc with 6.2 x 5.6 cm and 5.8 x 4.8 cm intramural fibroids.  Preop Hgb 11.0.  Pertinent Gynecological History: Menses: flow is excessive with use of 7 pads or tampons on heaviest days Bleeding: regular Contraception: condoms DES exposure: unknown Blood transfusions: none Sexually transmitted diseases: no past history Previous GYN Procedures:  n/a   Last mammogram:  n/a  Date: n/a Last pap: normal Date: 05/01/21 OB History: G0  Menstrual History: Menarche age: n/a Patient's last menstrual period was 10/31/2021 (exact date).    Past Medical History:  Diagnosis Date   COVID 02/20/2019   fever, HA, myalgias, arthralgias, cough, mild SOB, vomiting, loss of taste & smell   Iron deficiency anemia due to chronic blood loss 07/12/2019   hx of multiple iron infustions, followed by Sheryn Bison, FNP @ [facility], LOV 10/25/19 in [place].   Menometrorrhagia 07/12/2019   Patient follows with Dr. Linda Hedges and is scheduled for an abdominal myomectomy on 12/10/21.   Pre-Diabetes    HgbA1c 5.8 on 05/08/21   Wears contact lenses    Wears glasses     Past Surgical History:  Procedure Laterality Date   WISDOM TOOTH EXTRACTION  2015    History reviewed. No pertinent family history.  Social History:  reports that she has never smoked. She has never used smokeless tobacco. She reports current alcohol use. She reports that she does not use drugs.  Allergies:  Allergies  Allergen Reactions   Dust Mite Extract Swelling    Generalized swelling, throat swells   Molds & Smuts Swelling    Generalized swelling, throat  swells    No medications prior to admission.    Review of Systems  Height '5\' 9"'$  (1.753 m), weight 133.8 kg, last menstrual period 10/31/2021. Physical Exam Constitutional:      Appearance: Normal appearance.  HENT:     Head: Normocephalic and atraumatic.  Pulmonary:     Effort: Pulmonary effort is normal.  Abdominal:     Palpations: Abdomen is soft.  Musculoskeletal:        General: Normal range of motion.     Cervical back: Normal range of motion.  Skin:    General: Skin is warm and dry.  Neurological:     Mental Status: She is alert and oriented to person, place, and time.  Psychiatric:        Mood and Affect: Mood normal.        Behavior: Behavior normal.    No results found for this or any previous visit (from the past 24 hour(s)).  No results found.  Assessment/Plan: 31 yo G0 with symptomatic uterine leiomyomata -Abdominal myomectomy -Patient is counseled re: risk of bleeding, infection, scarring and damage to surrounding structures.  She is counseled re: risk but possible risk of needing blood transfusion and hysterectomy.  She is informed of the possibility of recurrence of fibroids.  She is also counseled that should she have pregnancies, she will required C/S delivery at early term.  She is informed of the steps of the procedure itself as well as postop expectations.  All questions were answered and patient wishes to  proceed.  Linda Hedges 12/05/2021, 2:11 PM

## 2021-12-05 NOTE — Progress Notes (Addendum)
Patient blood pressure 168/97, blood pressure recheck 147/104, patient states blood pressure is not normally this high and she does not take blood pressure medications, ekg done with pre op lab work. Blood pressure after ekg 156-/110, pt to call pcp [facility] in [place] and make aware of elevated blood pressure. Called Newell Coral at dr Lynnette Caffey office to make aware of elevated blood pressure and pt to see pcp.

## 2021-12-07 ENCOUNTER — Encounter (HOSPITAL_BASED_OUTPATIENT_CLINIC_OR_DEPARTMENT_OTHER): Payer: Self-pay | Admitting: Obstetrics & Gynecology

## 2021-12-07 NOTE — Progress Notes (Addendum)
Spoke with patient by phone, pt stated she is now on blood pressure medication from  her pcp   Losartan 25 mg daily, patient taking medication in pm. Lov pcp Renaldo Reel PA  [facility] 12-06-2021 on chart, blood pressure was 142/94 12-06-2021 lov with pcp. Patient states blood pressure check this am was 137/90. Patient states she will not start [facility] for several months.  Spoke with Dr  Macario Golds mda and made aware patient is on Losartan 25 mg po daily in pm for elevated blood pressure.no new orders received from Dr Macario Golds mda.

## 2021-12-10 ENCOUNTER — Other Ambulatory Visit: Payer: Self-pay

## 2021-12-10 ENCOUNTER — Observation Stay (HOSPITAL_BASED_OUTPATIENT_CLINIC_OR_DEPARTMENT_OTHER)
Admission: RE | Admit: 2021-12-10 | Discharge: 2021-12-11 | Disposition: A | Discharge status: Home / Self Care | Payer: BC Managed Care – PPO | Attending: Obstetrics & Gynecology | Admitting: Obstetrics & Gynecology

## 2021-12-10 ENCOUNTER — Observation Stay (HOSPITAL_BASED_OUTPATIENT_CLINIC_OR_DEPARTMENT_OTHER): Payer: BC Managed Care – PPO | Admitting: Anesthesiology

## 2021-12-10 ENCOUNTER — Encounter (HOSPITAL_BASED_OUTPATIENT_CLINIC_OR_DEPARTMENT_OTHER)
Admission: RE | Disposition: A | Discharge status: Home / Self Care | Payer: Self-pay | Source: Home / Self Care | Attending: Obstetrics & Gynecology

## 2021-12-10 ENCOUNTER — Encounter (HOSPITAL_BASED_OUTPATIENT_CLINIC_OR_DEPARTMENT_OTHER): Payer: Self-pay | Admitting: Obstetrics & Gynecology

## 2021-12-10 DIAGNOSIS — Z01818 Encounter for other preprocedural examination: Secondary | ICD-10-CM

## 2021-12-10 DIAGNOSIS — N920 Excessive and frequent menstruation with regular cycle: Secondary | ICD-10-CM | POA: Insufficient documentation

## 2021-12-10 DIAGNOSIS — Z9889 Other specified postprocedural states: Secondary | ICD-10-CM

## 2021-12-10 DIAGNOSIS — D259 Leiomyoma of uterus, unspecified: Principal | ICD-10-CM

## 2021-12-10 DIAGNOSIS — D251 Intramural leiomyoma of uterus: Secondary | ICD-10-CM

## 2021-12-10 DIAGNOSIS — Z8616 Personal history of COVID-19: Secondary | ICD-10-CM | POA: Diagnosis not present

## 2021-12-10 HISTORY — DX: Essential (primary) hypertension: I10

## 2021-12-10 HISTORY — DX: Presence of spectacles and contact lenses: Z97.3

## 2021-12-10 HISTORY — PX: MYOMECTOMY: SHX85

## 2021-12-10 HISTORY — DX: Prediabetes: R73.03

## 2021-12-10 LAB — CBC
HCT: 29.7 % — ABNORMAL LOW (ref 36.0–46.0)
Hemoglobin: 9.1 g/dL — ABNORMAL LOW (ref 12.0–15.0)
MCH: 24.7 pg — ABNORMAL LOW (ref 26.0–34.0)
MCHC: 30.6 g/dL (ref 30.0–36.0)
MCV: 80.5 fL (ref 80.0–100.0)
Platelets: 313 10*3/uL (ref 150–400)
RBC: 3.69 MIL/uL — ABNORMAL LOW (ref 3.87–5.11)
RDW: 16.6 % — ABNORMAL HIGH (ref 11.5–15.5)
WBC: 14.7 10*3/uL — ABNORMAL HIGH (ref 4.0–10.5)
nRBC: 0 % (ref 0.0–0.2)

## 2021-12-10 LAB — TYPE AND SCREEN
ABO/RH(D): O NEG
Antibody Screen: NEGATIVE

## 2021-12-10 LAB — POCT PREGNANCY, URINE: Preg Test, Ur: NEGATIVE

## 2021-12-10 LAB — ABO/RH: ABO/RH(D): O NEG

## 2021-12-10 SURGERY — MYOMECTOMY, ABDOMINAL APPROACH
Anesthesia: General | Site: Uterus

## 2021-12-10 MED ORDER — DEXAMETHASONE SODIUM PHOSPHATE 4 MG/ML IJ SOLN
INTRAMUSCULAR | Status: DC | PRN
  Administered 2021-12-10: 5 mg via INTRAVENOUS

## 2021-12-10 MED ORDER — CEFAZOLIN SODIUM-DEXTROSE 1-4 GM/50ML-% IV SOLN
INTRAVENOUS | Status: AC
  Filled 2021-12-10: qty 50

## 2021-12-10 MED ORDER — HEMOSTATIC AGENTS (NO CHARGE) OPTIME
TOPICAL | Status: DC | PRN
  Administered 2021-12-10: 1

## 2021-12-10 MED ORDER — OXYCODONE HCL 5 MG PO TABS
ORAL_TABLET | ORAL | Status: AC
  Filled 2021-12-10: qty 1

## 2021-12-10 MED ORDER — LACTATED RINGERS IV SOLN: INTRAVENOUS | Status: DC

## 2021-12-10 MED ORDER — FENTANYL CITRATE (PF) 250 MCG/5ML IJ SOLN
INTRAMUSCULAR | Status: AC
  Filled 2021-12-10: qty 5

## 2021-12-10 MED ORDER — 0.9 % SODIUM CHLORIDE (POUR BTL) OPTIME
TOPICAL | Status: DC | PRN
  Administered 2021-12-10: 2000 mL

## 2021-12-10 MED ORDER — ACETAMINOPHEN 500 MG PO TABS
1000.0000 mg | ORAL_TABLET | Freq: Four times a day (QID) | ORAL | Status: DC
  Administered 2021-12-10 – 2021-12-11 (×3): 1000 mg via ORAL

## 2021-12-10 MED ORDER — CEFAZOLIN IN SODIUM CHLORIDE 3-0.9 GM/100ML-% IV SOLN
3.0000 g | Freq: Once | INTRAVENOUS | Status: AC
  Administered 2021-12-10: 3 g via INTRAVENOUS

## 2021-12-10 MED ORDER — OXYCODONE HCL 5 MG PO TABS
5.0000 mg | ORAL_TABLET | ORAL | Status: DC | PRN
  Administered 2021-12-10: 10 mg via ORAL
  Administered 2021-12-10 (×3): 5 mg via ORAL
  Administered 2021-12-11 (×2): 10 mg via ORAL

## 2021-12-10 MED ORDER — FENTANYL CITRATE (PF) 100 MCG/2ML IJ SOLN
INTRAMUSCULAR | Status: AC
  Filled 2021-12-10: qty 2

## 2021-12-10 MED ORDER — IBUPROFEN 200 MG PO TABS
ORAL_TABLET | ORAL | Status: AC
  Filled 2021-12-10: qty 3

## 2021-12-10 MED ORDER — MENTHOL 3 MG MT LOZG: 1.0000 | LOZENGE | OROMUCOSAL | Status: DC | PRN

## 2021-12-10 MED ORDER — ACETAMINOPHEN 10 MG/ML IV SOLN
INTRAVENOUS | Status: AC
  Filled 2021-12-10: qty 100

## 2021-12-10 MED ORDER — ACETAMINOPHEN 500 MG PO TABS
ORAL_TABLET | ORAL | Status: AC
  Filled 2021-12-10: qty 2

## 2021-12-10 MED ORDER — SUGAMMADEX SODIUM 200 MG/2ML IV SOLN
INTRAVENOUS | Status: DC | PRN
  Administered 2021-12-10: 200 mg via INTRAVENOUS

## 2021-12-10 MED ORDER — PROMETHAZINE HCL 25 MG/ML IJ SOLN: 6.2500 mg | INTRAMUSCULAR | Status: DC | PRN

## 2021-12-10 MED ORDER — HYDROMORPHONE HCL 1 MG/ML IJ SOLN
0.2500 mg | INTRAMUSCULAR | Status: DC | PRN
  Administered 2021-12-10 (×2): 0.25 mg via INTRAVENOUS
  Administered 2021-12-10: 0.5 mg via INTRAVENOUS

## 2021-12-10 MED ORDER — MEPERIDINE HCL 25 MG/ML IJ SOLN: 6.2500 mg | INTRAMUSCULAR | Status: DC | PRN

## 2021-12-10 MED ORDER — LABETALOL HCL 5 MG/ML IV SOLN
INTRAVENOUS | Status: AC
  Filled 2021-12-10: qty 4

## 2021-12-10 MED ORDER — FENTANYL CITRATE (PF) 100 MCG/2ML IJ SOLN
INTRAMUSCULAR | Status: DC | PRN
  Administered 2021-12-10: 50 ug via INTRAVENOUS
  Administered 2021-12-10: 150 ug via INTRAVENOUS
  Administered 2021-12-10: 100 ug via INTRAVENOUS
  Administered 2021-12-10: 50 ug via INTRAVENOUS

## 2021-12-10 MED ORDER — IBUPROFEN 200 MG PO TABS
600.0000 mg | ORAL_TABLET | Freq: Four times a day (QID) | ORAL | Status: DC
  Administered 2021-12-10 – 2021-12-11 (×3): 600 mg via ORAL

## 2021-12-10 MED ORDER — MIDAZOLAM HCL 2 MG/2ML IJ SOLN
INTRAMUSCULAR | Status: AC
  Filled 2021-12-10: qty 2

## 2021-12-10 MED ORDER — ROCURONIUM BROMIDE 100 MG/10ML IV SOLN
INTRAVENOUS | Status: DC | PRN
  Administered 2021-12-10: 80 mg via INTRAVENOUS
  Administered 2021-12-10: 20 mg via INTRAVENOUS

## 2021-12-10 MED ORDER — HYDROMORPHONE HCL 1 MG/ML IJ SOLN
0.2000 mg | INTRAMUSCULAR | Status: DC | PRN
  Administered 2021-12-10: 0.6 mg via INTRAVENOUS

## 2021-12-10 MED ORDER — ONDANSETRON HCL 4 MG PO TABS: 4.0000 mg | ORAL_TABLET | Freq: Four times a day (QID) | ORAL | Status: DC | PRN

## 2021-12-10 MED ORDER — ONDANSETRON HCL 4 MG/2ML IJ SOLN
INTRAMUSCULAR | Status: DC | PRN
  Administered 2021-12-10: 4 mg via INTRAVENOUS

## 2021-12-10 MED ORDER — LIDOCAINE HCL (PF) 2 % IJ SOLN
INTRAMUSCULAR | Status: AC
  Filled 2021-12-10: qty 5

## 2021-12-10 MED ORDER — ONDANSETRON HCL 4 MG/2ML IJ SOLN
INTRAMUSCULAR | Status: AC
  Filled 2021-12-10: qty 2

## 2021-12-10 MED ORDER — POVIDONE-IODINE 10 % EX SWAB: 2.0000 | Freq: Once | CUTANEOUS | Status: DC

## 2021-12-10 MED ORDER — HYDROMORPHONE HCL 1 MG/ML IJ SOLN
INTRAMUSCULAR | Status: AC
  Filled 2021-12-10: qty 1

## 2021-12-10 MED ORDER — ONDANSETRON HCL 4 MG/2ML IJ SOLN: 4.0000 mg | Freq: Four times a day (QID) | INTRAMUSCULAR | Status: DC | PRN

## 2021-12-10 MED ORDER — DOCUSATE SODIUM 100 MG PO CAPS
100.0000 mg | ORAL_CAPSULE | Freq: Two times a day (BID) | ORAL | Status: DC
  Administered 2021-12-10 (×2): 100 mg via ORAL

## 2021-12-10 MED ORDER — CEFAZOLIN SODIUM-DEXTROSE 2-4 GM/100ML-% IV SOLN
INTRAVENOUS | Status: AC
  Filled 2021-12-10: qty 100

## 2021-12-10 MED ORDER — LABETALOL HCL 5 MG/ML IV SOLN
INTRAVENOUS | Status: DC | PRN
  Administered 2021-12-10 (×2): 5 mg via INTRAVENOUS

## 2021-12-10 MED ORDER — LIDOCAINE HCL (CARDIAC) PF 100 MG/5ML IV SOSY
PREFILLED_SYRINGE | INTRAVENOUS | Status: DC | PRN
  Administered 2021-12-10: 100 mg via INTRAVENOUS

## 2021-12-10 MED ORDER — PROPOFOL 10 MG/ML IV BOLUS
INTRAVENOUS | Status: DC | PRN
  Administered 2021-12-10: 250 mg via INTRAVENOUS
  Administered 2021-12-10: 100 mg via INTRAVENOUS

## 2021-12-10 MED ORDER — EPHEDRINE SULFATE (PRESSORS) 50 MG/ML IJ SOLN
INTRAMUSCULAR | Status: DC | PRN
  Administered 2021-12-10: 5 mg via INTRAVENOUS

## 2021-12-10 MED ORDER — ACETAMINOPHEN 10 MG/ML IV SOLN
1000.0000 mg | Freq: Once | INTRAVENOUS | Status: DC | PRN
  Administered 2021-12-10: 1000 mg via INTRAVENOUS

## 2021-12-10 MED ORDER — SIMETHICONE 80 MG PO CHEW: 80.0000 mg | CHEWABLE_TABLET | Freq: Four times a day (QID) | ORAL | Status: DC | PRN

## 2021-12-10 MED ORDER — PROPOFOL 10 MG/ML IV BOLUS
INTRAVENOUS | Status: AC
  Filled 2021-12-10: qty 20

## 2021-12-10 MED ORDER — MIDAZOLAM HCL 5 MG/5ML IJ SOLN
INTRAMUSCULAR | Status: DC | PRN
  Administered 2021-12-10: 2 mg via INTRAVENOUS

## 2021-12-10 MED ORDER — DOCUSATE SODIUM 100 MG PO CAPS
ORAL_CAPSULE | ORAL | Status: AC
  Filled 2021-12-10: qty 1

## 2021-12-10 MED ORDER — OXYCODONE HCL 5 MG PO TABS
ORAL_TABLET | ORAL | Status: AC
  Filled 2021-12-10: qty 2

## 2021-12-10 MED ORDER — DEXAMETHASONE SODIUM PHOSPHATE 10 MG/ML IJ SOLN
INTRAMUSCULAR | Status: AC
  Filled 2021-12-10: qty 1

## 2021-12-10 SURGICAL SUPPLY — 41 items
APL SKNCLS STERI-STRIP NONHPOA (GAUZE/BANDAGES/DRESSINGS) ×1
BARRIER ADHS 3X4 INTERCEED (GAUZE/BANDAGES/DRESSINGS) IMPLANT
BENZOIN TINCTURE PRP APPL 2/3 (GAUZE/BANDAGES/DRESSINGS) ×1 IMPLANT
BRR ADH 4X3 ABS CNTRL BYND (GAUZE/BANDAGES/DRESSINGS) ×1
DRAPE CESAREAN BIRTH W POUCH (DRAPES) ×1 IMPLANT
DRAPE WARM FLUID 44X44 (DRAPES) ×1 IMPLANT
DRSG OPSITE POSTOP 4X10 (GAUZE/BANDAGES/DRESSINGS) ×1 IMPLANT
DURAPREP 26ML APPLICATOR (WOUND CARE) ×1 IMPLANT
ELECT REM PT RETURN 9FT ADLT (ELECTROSURGICAL) ×1
ELECTRODE REM PT RTRN 9FT ADLT (ELECTROSURGICAL) ×1 IMPLANT
GAUZE 4X4 16PLY ~~LOC~~+RFID DBL (SPONGE) ×1 IMPLANT
GAUZE PAD ABD 8X10 STRL (GAUZE/BANDAGES/DRESSINGS) IMPLANT
GLOVE BIO SURGEON STRL SZ 6 (GLOVE) ×1 IMPLANT
GLOVE BIO SURGEON STRL SZ7 (GLOVE) IMPLANT
GLOVE BIOGEL PI IND STRL 6 (GLOVE) ×1 IMPLANT
GLOVE BIOGEL PI IND STRL 7.0 (GLOVE) IMPLANT
GLOVE ECLIPSE 6.0 STRL STRAW (GLOVE) ×1 IMPLANT
GLOVE SS PI  5.5 STRL (GLOVE) ×1
GLOVE SS PI 5.5 STRL (GLOVE) ×1 IMPLANT
GLOVE SURG SS PI 6.5 STRL IVOR (GLOVE) IMPLANT
GOWN STRL REUS W/TWL LRG LVL3 (GOWN DISPOSABLE) ×1 IMPLANT
HEMOSTAT SURGICEL 4X8 (HEMOSTASIS) IMPLANT
HOLDER FOLEY CATH W/STRAP (MISCELLANEOUS) IMPLANT
KIT TURNOVER CYSTO (KITS) ×1 IMPLANT
NS IRRIG 1000ML POUR BTL (IV SOLUTION) IMPLANT
PACK ABDOMINAL GYN (CUSTOM PROCEDURE TRAY) ×1 IMPLANT
PAD OB MATERNITY 4.3X12.25 (PERSONAL CARE ITEMS) ×1 IMPLANT
SPONGE T-LAP 18X18 ~~LOC~~+RFID (SPONGE) ×1 IMPLANT
STRIP CLOSURE SKIN 1/2X4 (GAUZE/BANDAGES/DRESSINGS) ×1 IMPLANT
SUT MNCRL 0 VIOLET 6X18 (SUTURE) IMPLANT
SUT MNCRL+ AB 3-0 CT1 36 (SUTURE) ×1 IMPLANT
SUT MONOCRYL 0 6X18 (SUTURE) ×1
SUT MONOCRYL AB 3-0 CT1 36IN (SUTURE) ×1
SUT PLAIN 2 0 XLH (SUTURE) IMPLANT
SUT VIC AB 0 CT1 18XCR BRD 8 (SUTURE) IMPLANT
SUT VIC AB 0 CT1 36 (SUTURE) IMPLANT
SUT VIC AB 0 CT1 8-18 (SUTURE) ×5
SUT VIC AB 4-0 KS 27 (SUTURE) ×1 IMPLANT
SYR BULB IRRIG 60ML STRL (SYRINGE) IMPLANT
TOWEL OR 17X26 10 PK STRL BLUE (TOWEL DISPOSABLE) ×1 IMPLANT
TRAY FOLEY W/BAG SLVR 14FR LF (SET/KITS/TRAYS/PACK) ×1 IMPLANT

## 2021-12-10 NOTE — Progress Notes (Signed)
Day of Surgery Procedure(s) (LRB): ABDOMINAL MYOMECTOMY (N/A)  Subjective: Patient reports incisional pain and tolerating PO.  No CP/SOB  Objective: I have reviewed patient's vital signs, intake and output, and medications.     12/10/2021   11:45 AM 12/10/2021   11:35 AM 12/10/2021   11:15 AM  Vitals with BMI  Systolic 006 349 494  Diastolic 90 82 80  Pulse 64 68 58   UOP clear yellow and adequate  General: alert, cooperative, and appears stated age Extremities: extremities normal, atraumatic, no cyanosis or edema Abd: soft, dressing (pressure) clean and dry  Assessment: s/p Procedure(s): ABDOMINAL MYOMECTOMY (N/A): stable and progressing well  Plan: Advance diet Encourage ambulation Advance to PO medication AM labs pending D/C IVF and foley in AM   LOS: 0 days    Linda Hedges, DO 12/10/2021, 1:39 PM

## 2021-12-10 NOTE — Anesthesia Postprocedure Evaluation (Signed)
Anesthesia Post Note  Patient: Cassandra Reese  Procedure(s) Performed: ABDOMINAL MYOMECTOMY (Uterus)     Patient location during evaluation: PACU Anesthesia Type: General Level of consciousness: awake and sedated Pain management: pain level controlled Vital Signs Assessment: post-procedure vital signs reviewed and stable Respiratory status: spontaneous breathing Cardiovascular status: stable Postop Assessment: no apparent nausea or vomiting Anesthetic complications: no   No notable events documented.  Last Vitals:  Vitals:   12/10/21 1030 12/10/21 1045  BP: 139/87 (!) 121/94  Pulse: 60 (!) 58  Resp: 17 11  Temp:    SpO2: 94% 95%    Last Pain:  Vitals:   12/10/21 1030  TempSrc:   PainSc: [place]

## 2021-12-10 NOTE — Transfer of Care (Signed)
Immediate Anesthesia Transfer of Care Note  Patient: Cassandra Reese  Procedure(s) Performed: Procedure(s) (LRB): ABDOMINAL MYOMECTOMY (N/A)  Patient Location: PACU  Anesthesia Type: General  Level of Consciousness: awake, sedated, patient cooperative and responds to stimulation  Airway & Oxygen Therapy: Patient Spontanous Breathing and Patient connected to [place] 02   Post-op Assessment: Report given to PACU RN, Post -op Vital signs reviewed and stable and Patient moving all extremities  Post vital signs: Reviewed and stable  Complications: No apparent anesthesia complications

## 2021-12-10 NOTE — Anesthesia Preprocedure Evaluation (Addendum)
Anesthesia Evaluation  Patient identified by MRN, date of birth, ID band Patient awake    Reviewed: Allergy & Precautions, NPO status , Patient's Chart, lab work & pertinent test results  Airway Mallampati: I       Dental no notable dental hx.    Pulmonary neg pulmonary ROS,    Pulmonary exam normal        Cardiovascular hypertension, Pt. on medications Normal cardiovascular exam     Neuro/Psych negative neurological ROS  negative psych ROS   GI/Hepatic negative GI ROS, Neg liver ROS,   Endo/Other  Morbid obesity  Renal/GU   negative genitourinary   Musculoskeletal negative musculoskeletal ROS (+)   Abdominal (+) + obese,   Peds  Hematology  (+) Blood dyscrasia, ,   Anesthesia Other Findings   Reproductive/Obstetrics                            Anesthesia Physical Anesthesia Plan  ASA: 3  Anesthesia Plan: General   Post-op Pain Management:    Induction: Intravenous  PONV Risk Score and Plan: 4 or greater and Ondansetron, Dexamethasone and Midazolam  Airway Management Planned: Oral ETT  Additional Equipment: None  Intra-op Plan:   Post-operative Plan: Extubation in OR  Informed Consent: I have reviewed the patients History and Physical, chart, labs and discussed the procedure including the risks, benefits and alternatives for the proposed anesthesia with the patient or authorized representative who has indicated his/her understanding and acceptance.     Dental advisory given  Plan Discussed with: CRNA  Anesthesia Plan Comments:        Anesthesia Quick Evaluation

## 2021-12-10 NOTE — Op Note (Signed)
Cassandra Reese PROCEDURE DATE: 12/10/2021   PREOPERATIVE DIAGNOSIS:  Symptomatic fibroids, menorrhagia POSTOPERATIVE DIAGNOSIS:  Symptomatic fibroids, menorrhagia SURGEON:   Dr. Linda Hedges ASSISTANT:   Dr. Molli Posey OPERATION:  Abdominal myomectomy ANESTHESIA:  General endotracheal.   INDICATIONS: The patient is a 31 y.o. with history of symptomatic uterine fibroids/menorrhagia desiring uterine preservation. The patient made a decision to undergo definite surgical treatment. On the preoperative visit, the risks, benefits, indications, and alternatives of the procedure were reviewed with the patient.  On the day of surgery, the risks of surgery were again discussed with the patient including but not limited to: bleeding which may require transfusion or reoperation; infection which may require antibiotics; injury to bowel, bladder, ureters or other surrounding organs; need for additional procedures; thromboembolic phenomenon, incisional problems and other postoperative/anesthesia complications. Written informed consent was obtained.     OPERATIVE FINDINGS: An enlarged 14 week size uterus with Palpable anterior fibroids x 2 (lower uterine segment and corpus). Normal tubes and ovaries bilaterally.   ESTIMATED BLOOD LOSS: 900 ml SPECIMENS:  Uterine leiomyomata x 2 sent to pathology COMPLICATIONS:  None immediate.     DESCRIPTION OF PROCEDURE: The patient received intravenous antibiotics and had sequential compression devices applied to her lower extremities while in the preoperative area.   She was taken to the operating room and placed under general anesthesia without difficulty and found to be adequate.The abdomen and perineum were prepped and draped in a sterile manner, and a Foley catheter was inserted into the bladder and attached to constant drainage. After an adequate timeout was performed, a Pfannensteil skin incision was made. This incision was taken down to the fascia using  electrocautery with care given to maintain good hemostasis. The fascia was incised in the midline and the fascial incision was then extended bilaterally using electrocautery without difficulty. The fascia was then dissected off the underlying rectus muscles using blunt and sharp dissection. The rectus muscles were split bluntly in the midline and the peritoneum entered sharply without complication. This peritoneal incision was then extended superiorly and inferiorly with care given to prevent bowel or bladder injury. Upon entry into the abdominal cavity, the upper abdomen was inspected and found to be normal. Attention was then turned to the pelvis. The uterus at this point was noted to be mobilized and elevated out of the abdomen. Anterior intramural fibroid was noted and incision was made in anterior to posterior orientation to allow exposure of fibroid.  Fibroid plane was developed sharply and bluntly and removed.  The fibroid bed was closed in 3 layer of 0 vicryl figure of eight stitches to hemostasis.  A second serosal incision was made in the anterior lower uterine segment to allow delivery of another intramural fibroid which was removed in the same fashion.  Again, the fibroid bed was closed in 3 layers using 0 vicryl figure of eight stitches.  Hemostasis was observed.  The uterus was returned to the abdomen and irrigation performed until clear.  A single piece of Intercede was placed over anterior uterus for adhesion prevention.  The pelvis was again examined and hemostasis was reconfirmed.  All laparotomy sponges and instruments were removed from the abdomen. The peritoneum was closed with 3-0 monocrryl, and the fascia was closed with 0 Vicryl in a running fashion. The subcutaneous tissue was reapproximated using 3 interrupted plain gut stitches.  The skin was closed with a 4-0 vicryl subcuticular stitch. Sponge, lap, needle, and instrument counts were correct times two. The patient was taken  to the  recovery area awake, extubated and in stable condition.

## 2021-12-10 NOTE — Progress Notes (Signed)
At preop visit, patient was hypertensive.  She saw PCP the next day and was started on losartan 25 mg qhs.  She has monitored BP at home and it is improved.  No other changes in H&P.  Linda Hedges, DO

## 2021-12-10 NOTE — Anesthesia Procedure Notes (Signed)
Procedure Name: Intubation Date/Time: 12/10/2021 7:40 AM  Performed by: Justice Rocher, CRNAPre-anesthesia Checklist: Patient identified, Emergency Drugs available, Suction available, Patient being monitored and Timeout performed Patient Re-evaluated:Patient Re-evaluated prior to induction Oxygen Delivery Method: Circle system utilized Preoxygenation: Pre-oxygenation with 100% oxygen Induction Type: IV induction Ventilation: Mask ventilation without difficulty Laryngoscope Size: Mac and 4 Grade View: Grade II Tube type: Oral Tube size: 7.5 mm Number of attempts: 1 Airway Equipment and Method: Stylet and Oral airway Placement Confirmation: ETT inserted through vocal cords under direct vision, positive ETCO2, breath sounds checked- equal and bilateral and CO2 detector Secured at: 24 cm Tube secured with: Tape Dental Injury: Teeth and Oropharynx as per pre-operative assessment

## 2021-12-11 DIAGNOSIS — D259 Leiomyoma of uterus, unspecified: Secondary | ICD-10-CM | POA: Diagnosis not present

## 2021-12-11 LAB — COMPREHENSIVE METABOLIC PANEL
ALT: 10 U/L (ref 0–44)
AST: 18 U/L (ref 15–41)
Albumin: 2.8 g/dL — ABNORMAL LOW (ref 3.5–5.0)
Alkaline Phosphatase: 40 U/L (ref 38–126)
Anion gap: 6 (ref 5–15)
BUN: 9 mg/dL (ref 6–20)
CO2: 26 mmol/L (ref 22–32)
Calcium: 8.3 mg/dL — ABNORMAL LOW (ref 8.9–10.3)
Chloride: 103 mmol/L (ref 98–111)
Creatinine, Ser: 0.81 mg/dL (ref 0.44–1.00)
GFR, Estimated: >60 mL/min (ref >60)
Glucose, Bld: 138 mg/dL — ABNORMAL HIGH (ref 70–99)
Potassium: 4.3 mmol/L (ref 3.5–5.1)
Sodium: 135 mmol/L (ref 135–145)
Total Bilirubin: 0.6 mg/dL (ref 0.3–1.2)
Total Protein: 5.8 g/dL — ABNORMAL LOW (ref 6.5–8.1)

## 2021-12-11 LAB — SURGICAL PATHOLOGY

## 2021-12-11 LAB — CBC
HCT: 25.6 % — ABNORMAL LOW (ref 36.0–46.0)
Hemoglobin: 7.7 g/dL — ABNORMAL LOW (ref 12.0–15.0)
MCH: 24.7 pg — ABNORMAL LOW (ref 26.0–34.0)
MCHC: 30.1 g/dL (ref 30.0–36.0)
MCV: 82.1 fL (ref 80.0–100.0)
Platelets: 255 10*3/uL (ref 150–400)
RBC: 3.12 MIL/uL — ABNORMAL LOW (ref 3.87–5.11)
RDW: 16.8 % — ABNORMAL HIGH (ref 11.5–15.5)
WBC: 13 10*3/uL — ABNORMAL HIGH (ref 4.0–10.5)
nRBC: 0 % (ref 0.0–0.2)

## 2021-12-11 MED ORDER — SODIUM CHLORIDE 0.9 % IV BOLUS
500.0000 mL | Freq: Once | INTRAVENOUS | Status: AC
  Administered 2021-12-11: 500 mL via INTRAVENOUS

## 2021-12-11 MED ORDER — OXYCODONE HCL 5 MG PO TABS
ORAL_TABLET | ORAL | Status: AC
  Filled 2021-12-11: qty 2

## 2021-12-11 MED ORDER — IBUPROFEN 200 MG PO TABS
ORAL_TABLET | ORAL | Status: AC
  Filled 2021-12-11: qty 3

## 2021-12-11 MED ORDER — IBUPROFEN 600 MG PO TABS: 600.0000 mg | ORAL_TABLET | Freq: Four times a day (QID) | ORAL | 0 refills | Status: DC

## 2021-12-11 MED ORDER — OXYCODONE-ACETAMINOPHEN 5-325 MG PO TABS: 1.0000 | ORAL_TABLET | ORAL | 0 refills | Status: AC | PRN

## 2021-12-11 MED ORDER — ACETAMINOPHEN 500 MG PO TABS
ORAL_TABLET | ORAL | Status: AC
  Filled 2021-12-11: qty 2

## 2021-12-11 NOTE — Discharge Summary (Signed)
Physician Discharge Summary  Patient ID: Cassandra Reese MRN: [id number] DOB/AGE: 03/18/1990 31 y.o.  Admit date: 12/10/2021 Discharge date: 12/11/2021  Admission Diagnoses: symptomatic uterine leiomyomata  Discharge Diagnoses:  Principal Problem:   Uterine leiomyoma Active Problems:   S/P myomectomy   Discharged Condition: good  Hospital Course: Patient was admitted for abdominal myomectomy to treat symptomatic uterine leiomyomata.  Patient underwent anticipated procedure and had EBL 900 cc.  Vital signs remained stable throughout postop course.  Hemoglobin fell from 11.0 preop to 7.7 POD1.  She was able to ambulate without orthostatic symptoms.  She was passing flatus, had adequate pain control on po meds, voiding without difficulty and tolerating po.  She was discharged home on POD1 with planned office follow up in 2 weeks.    Consults: None  Significant Diagnostic Studies: n/a  Treatments: surgery: Abdominal myomectomy  Discharge Exam: Blood pressure (!) 129/59, pulse 70, temperature 98.6 F (37 C), resp. rate 16, height '5\' 9"'$  (1.753 m), weight (!) 139.8 kg, last menstrual period 10/31/2021, SpO2 95 %. General appearance: alert, cooperative, and appears stated age Pelvic: scant dark blood on pad Extremities: extremities normal, atraumatic, no cyanosis or edema Incision/Wound: Abd soft, ND.  Pressure dressing removed and honeycomb with scant stain on right and left; good seal.  Disposition: Discharge disposition: 01-Home or Self Care       Discharge Instructions     Discharge patient   Complete by: As directed    Discharge disposition: 01-Home or Self Care   Discharge patient date: 12/11/2021      Allergies as of 12/11/2021       Reactions   Dust Mite Extract Swelling   Generalized swelling, throat swells   Molds & Smuts Swelling   Generalized swelling, throat swells        Medication List     STOP taking these medications    EPINEPHrine 0.3 mg/0.3 mL  Soaj injection Commonly known as: EPI-PEN       TAKE these medications    diphenhydrAMINE 50 MG tablet Commonly known as: BENADRYL Take 1 tablet (50 mg total) by mouth every 6 (six) hours. Until swelling resolves What changed:  when to take this reasons to take this   ergocalciferol 1.25 MG (50000 UT) capsule Commonly known as: VITAMIN D2 Take 50,000 Units by mouth once a week.   ibuprofen 600 MG tablet Commonly known as: ADVIL Take 1 tablet (600 mg total) by mouth 4 (four) times daily.   losartan 25 MG tablet Commonly known as: COZAAR Take 25 mg by mouth every evening.   multivitamin with minerals Tabs tablet Take 1 tablet by mouth daily. Centrum Women's MVI w/iron   oxyCODONE-acetaminophen 5-325 MG tablet Commonly known as: Percocet Take 1 tablet by mouth every 4 (four) hours as needed for up to 7 days for severe pain.   PreviDent 5000 Booster Plus 1.1 % Pste Generic drug: Sodium Fluoride 2 (two) times daily.   tranexamic acid 650 MG Tabs tablet Commonly known as: LYSTEDA Take 1,300 mg by mouth as needed.         Signed: Linda Hedges 12/11/2021, 8:25 AM

## 2021-12-11 NOTE — Progress Notes (Signed)
1 Day Post-Op Procedure(s) (LRB): ABDOMINAL MYOMECTOMY (N/A)  Subjective: Patient reports tolerating PO, + flatus, and no problems voiding.  Feeling much better this am.  Had near syncopal episode when attempting ambulation around 6 pm last night.  Now able to ambulate without difficult.    Objective: I have reviewed patient's vital signs, intake and output, medications, and labs.     12/11/2021    5:00 AM 12/11/2021    2:00 AM 12/10/2021    9:30 PM  Vitals with BMI  Systolic 482 707 867  Diastolic 59 57 66  Pulse 70 77 85      Latest Ref Rng & Units 12/11/2021    6:10 AM 12/10/2021    9:45 PM 12/05/2021    1:30 PM  CBC  WBC 4.0 - 10.5 K/uL 13.0  14.7  6.3   Hemoglobin 12.0 - 15.0 g/dL 7.7  9.1  11.0   Hematocrit 36.0 - 46.0 % 25.6  29.7  36.6   Platelets 150 - 400 K/uL 255  313  318       Latest Ref Rng & Units 12/11/2021   12:45 AM 12/05/2021    1:30 PM 08/25/2019    8:42 AM  CMP  Glucose 70 - 99 mg/dL 138  85  129   BUN 6 - 20 mg/dL '9  11  6   '$ Creatinine 0.44 - 1.00 mg/dL 0.81  0.84  0.74   Sodium 135 - 145 mmol/L 135  139  143   Potassium 3.5 - 5.1 mmol/L 4.3  3.8  3.8   Chloride 98 - 111 mmol/L 103  108  106   CO2 22 - 32 mmol/L '26  26  29   '$ Calcium 8.9 - 10.3 mg/dL 8.3  8.9  9.0   Total Protein 6.5 - 8.1 g/dL 5.8  7.1  6.6   Total Bilirubin 0.3 - 1.2 mg/dL 0.6  0.4  0.4   Alkaline Phos 38 - 126 U/L 40  61  65   AST 15 - 41 U/L '18  18  9   '$ ALT 0 - 44 U/L '10  12  7      '$ General: alert, cooperative, and appears stated age GI: incision: pressure dressing removed.  Honeycomb with good seal and small stain on right and left side of dressing Extremities: extremities normal, atraumatic, no cyanosis or edema Vaginal Bleeding: minimal  Assessment: s/p Procedure(s): ABDOMINAL MYOMECTOMY (N/A): stable, progressing well, and tolerating diet Acute blood loss on chronic anemia  Plan: Discharge home Anemia-Near syncopal episode last night; Hgb preop 11.0->9.1.  I felt  this was not accurate and likely represented inadequate volume replacement given EBL 900 cc.  UOP at that time around 50 cc/hr.  I gave 500 cc NS bolus at around 2AM.  AM labs were repeated and Hgb fell to 7.7.  I feel this is accurate.  Patient asymptomatic on ambulation.   She will continue MVI with iron on discharge.      LOS: 0 days    Linda Hedges, DO 12/11/2021, 8:29 AM

## 2021-12-11 NOTE — Discharge Instructions (Signed)
Call MD for T>100.4, heavy vaginal bleeding, severe abdominal pain, intractable nausea and/or vomiting, or respiratory distress.  Call office to schedule postop appointment in 2 weeks.  Pelvic rest in 2 weeks.  No driving while taking narcotics.

## 2021-12-12 ENCOUNTER — Encounter (HOSPITAL_BASED_OUTPATIENT_CLINIC_OR_DEPARTMENT_OTHER): Payer: Self-pay | Admitting: Obstetrics & Gynecology

## 2021-12-24 ENCOUNTER — Other Ambulatory Visit: Payer: Self-pay

## 2021-12-24 ENCOUNTER — Emergency Department (HOSPITAL_COMMUNITY): Payer: BC Managed Care – PPO

## 2021-12-24 ENCOUNTER — Encounter (HOSPITAL_COMMUNITY): Payer: Self-pay

## 2021-12-24 ENCOUNTER — Observation Stay (HOSPITAL_COMMUNITY)
Admission: EM | Admit: 2021-12-24 | Discharge: 2021-12-25 | Disposition: A | Discharge status: Home / Self Care | Payer: BC Managed Care – PPO | Attending: Obstetrics & Gynecology | Admitting: Obstetrics & Gynecology

## 2021-12-24 DIAGNOSIS — D649 Anemia, unspecified: Secondary | ICD-10-CM

## 2021-12-24 DIAGNOSIS — I10 Essential (primary) hypertension: Secondary | ICD-10-CM | POA: Insufficient documentation

## 2021-12-24 DIAGNOSIS — Z79899 Other long term (current) drug therapy: Secondary | ICD-10-CM | POA: Insufficient documentation

## 2021-12-24 DIAGNOSIS — N939 Abnormal uterine and vaginal bleeding, unspecified: Secondary | ICD-10-CM | POA: Diagnosis present

## 2021-12-24 DIAGNOSIS — Z8616 Personal history of COVID-19: Secondary | ICD-10-CM | POA: Insufficient documentation

## 2021-12-24 DIAGNOSIS — Y838 Other surgical procedures as the cause of abnormal reaction of the patient, or of later complication, without mention of misadventure at the time of the procedure: Secondary | ICD-10-CM | POA: Diagnosis not present

## 2021-12-24 DIAGNOSIS — T888XXA Other specified complications of surgical and medical care, not elsewhere classified, initial encounter: Secondary | ICD-10-CM

## 2021-12-24 DIAGNOSIS — M96841 Postprocedural hematoma of a musculoskeletal structure following other procedure: Secondary | ICD-10-CM | POA: Diagnosis not present

## 2021-12-24 DIAGNOSIS — T8149XA Infection following a procedure, other surgical site, initial encounter: Secondary | ICD-10-CM | POA: Diagnosis present

## 2021-12-24 DIAGNOSIS — N99840 Postprocedural hematoma of a genitourinary system organ or structure following a genitourinary system procedure: Secondary | ICD-10-CM

## 2021-12-24 LAB — COMPREHENSIVE METABOLIC PANEL
ALT: 14 U/L (ref 0–44)
AST: 14 U/L — ABNORMAL LOW (ref 15–41)
Albumin: 2.8 g/dL — ABNORMAL LOW (ref 3.5–5.0)
Alkaline Phosphatase: 66 U/L (ref 38–126)
Anion gap: 7 (ref 5–15)
BUN: 11 mg/dL (ref 6–20)
CO2: 26 mmol/L (ref 22–32)
Calcium: 8.2 mg/dL — ABNORMAL LOW (ref 8.9–10.3)
Chloride: 101 mmol/L (ref 98–111)
Creatinine, Ser: 0.82 mg/dL (ref 0.44–1.00)
GFR, Estimated: >60 mL/min (ref >60)
Glucose, Bld: 95 mg/dL (ref 70–99)
Potassium: 3.4 mmol/L — ABNORMAL LOW (ref 3.5–5.1)
Sodium: 134 mmol/L — ABNORMAL LOW (ref 135–145)
Total Bilirubin: 0.5 mg/dL (ref 0.3–1.2)
Total Protein: 7.4 g/dL (ref 6.5–8.1)

## 2021-12-24 LAB — CBC WITH DIFFERENTIAL/PLATELET
Abs Immature Granulocytes: 0.08 10*3/uL — ABNORMAL HIGH (ref 0.00–0.07)
Basophils Absolute: 0 10*3/uL (ref 0.0–0.1)
Basophils Relative: 0 %
Eosinophils Absolute: 0 10*3/uL (ref 0.0–0.5)
Eosinophils Relative: 0 %
HCT: 27.9 % — ABNORMAL LOW (ref 36.0–46.0)
Hemoglobin: 8.6 g/dL — ABNORMAL LOW (ref 12.0–15.0)
Immature Granulocytes: 1 %
Lymphocytes Relative: 7 %
Lymphs Abs: 1.1 10*3/uL (ref 0.7–4.0)
MCH: 24.2 pg — ABNORMAL LOW (ref 26.0–34.0)
MCHC: 30.8 g/dL (ref 30.0–36.0)
MCV: 78.6 fL — ABNORMAL LOW (ref 80.0–100.0)
Monocytes Absolute: 1 10*3/uL (ref 0.1–1.0)
Monocytes Relative: 7 %
Neutro Abs: 12.8 10*3/uL — ABNORMAL HIGH (ref 1.7–7.7)
Neutrophils Relative %: 85 %
Platelets: 441 10*3/uL — ABNORMAL HIGH (ref 150–400)
RBC: 3.55 MIL/uL — ABNORMAL LOW (ref 3.87–5.11)
RDW: 15.5 % (ref 11.5–15.5)
WBC: 15 10*3/uL — ABNORMAL HIGH (ref 4.0–10.5)
nRBC: 0 % (ref 0.0–0.2)

## 2021-12-24 LAB — PROTIME-INR
INR: 1.5 — ABNORMAL HIGH (ref 0.8–1.2)
Prothrombin Time: 18.2 seconds — ABNORMAL HIGH (ref 11.4–15.2)

## 2021-12-24 LAB — TYPE AND SCREEN
ABO/RH(D): O NEG
Antibody Screen: NEGATIVE

## 2021-12-24 LAB — I-STAT BETA HCG BLOOD, ED (MC, WL, AP ONLY): I-stat hCG, quantitative: <5 m[IU]/mL (ref <5)

## 2021-12-24 LAB — CBC
HCT: 26 % — ABNORMAL LOW (ref 36.0–46.0)
Hemoglobin: 8.1 g/dL — ABNORMAL LOW (ref 12.0–15.0)
MCH: 24.6 pg — ABNORMAL LOW (ref 26.0–34.0)
MCHC: 31.2 g/dL (ref 30.0–36.0)
MCV: 79 fL — ABNORMAL LOW (ref 80.0–100.0)
Platelets: 427 10*3/uL — ABNORMAL HIGH (ref 150–400)
RBC: 3.29 MIL/uL — ABNORMAL LOW (ref 3.87–5.11)
RDW: 15.5 % (ref 11.5–15.5)
WBC: 15.1 10*3/uL — ABNORMAL HIGH (ref 4.0–10.5)
nRBC: 0 % (ref 0.0–0.2)

## 2021-12-24 LAB — APTT: aPTT: 32 seconds (ref 24–36)

## 2021-12-24 MED ORDER — FERROUS SULFATE 325 (65 FE) MG PO TABS
325.0000 mg | ORAL_TABLET | Freq: Every day | ORAL | Status: DC
  Filled 2021-12-24: qty 1

## 2021-12-24 MED ORDER — ACETAMINOPHEN 325 MG PO TABS
650.0000 mg | ORAL_TABLET | ORAL | Status: DC | PRN
  Administered 2021-12-24: 650 mg via ORAL
  Filled 2021-12-24: qty 2

## 2021-12-24 MED ORDER — HYDROMORPHONE HCL 1 MG/ML IJ SOLN
0.5000 mg | Freq: Once | INTRAMUSCULAR | Status: AC
  Administered 2021-12-24: 0.5 mg via INTRAVENOUS
  Filled 2021-12-24: qty 1

## 2021-12-24 MED ORDER — MORPHINE SULFATE (PF) 4 MG/ML IV SOLN
4.0000 mg | INTRAVENOUS | Status: AC | PRN
  Administered 2021-12-24 – 2021-12-25 (×3): 4 mg via INTRAVENOUS
  Filled 2021-12-24 (×3): qty 1

## 2021-12-24 MED ORDER — MEDROXYPROGESTERONE ACETATE 5 MG PO TABS: 20.0000 mg | ORAL_TABLET | Freq: Every day | ORAL | 0 refills | Status: AC

## 2021-12-24 MED ORDER — AMOXICILLIN-POT CLAVULANATE 875-125 MG PO TABS: 1.0000 | ORAL_TABLET | Freq: Two times a day (BID) | ORAL | 0 refills | Status: AC

## 2021-12-24 MED ORDER — LACTATED RINGERS IV SOLN: INTRAVENOUS | Status: DC

## 2021-12-24 MED ORDER — IBUPROFEN 600 MG PO TABS: 600.0000 mg | ORAL_TABLET | Freq: Four times a day (QID) | ORAL | 0 refills | Status: AC

## 2021-12-24 MED ORDER — FERROUS SULFATE 325 (65 FE) MG PO TABS: 325.0000 mg | ORAL_TABLET | Freq: Every day | ORAL | 0 refills | Status: AC

## 2021-12-24 MED ORDER — MEDROXYPROGESTERONE ACETATE 10 MG PO TABS
20.0000 mg | ORAL_TABLET | ORAL | Status: AC
  Administered 2021-12-24: 20 mg via ORAL
  Filled 2021-12-24: qty 2

## 2021-12-24 MED ORDER — OXYCODONE HCL 5 MG PO TABS: 5.0000 mg | ORAL_TABLET | Freq: Four times a day (QID) | ORAL | 0 refills | Status: AC | PRN

## 2021-12-24 MED ORDER — DOCUSATE SODIUM 100 MG PO CAPS
100.0000 mg | ORAL_CAPSULE | Freq: Two times a day (BID) | ORAL | Status: DC
  Administered 2021-12-24: 100 mg via ORAL
  Filled 2021-12-24: qty 1

## 2021-12-24 MED ORDER — ONDANSETRON HCL 4 MG/2ML IJ SOLN
4.0000 mg | Freq: Four times a day (QID) | INTRAMUSCULAR | Status: DC
  Administered 2021-12-24 – 2021-12-25 (×3): 4 mg via INTRAVENOUS
  Filled 2021-12-24 (×3): qty 2

## 2021-12-24 MED ORDER — POLYETHYLENE GLYCOL 3350 17 G PO PACK: 17.0000 g | PACK | Freq: Every day | ORAL | 0 refills | Status: AC

## 2021-12-24 MED ORDER — IOHEXOL 300 MG/ML  SOLN
100.0000 mL | Freq: Once | INTRAMUSCULAR | Status: AC | PRN
  Administered 2021-12-24: 100 mL via INTRAVENOUS

## 2021-12-24 MED ORDER — ONDANSETRON HCL 4 MG/2ML IJ SOLN
4.0000 mg | Freq: Once | INTRAMUSCULAR | Status: AC
  Administered 2021-12-24: 4 mg via INTRAVENOUS
  Filled 2021-12-24: qty 2

## 2021-12-24 MED ORDER — PIPERACILLIN-TAZOBACTAM 3.375 G IVPB 30 MIN
3.3750 g | Freq: Once | INTRAVENOUS | Status: AC
  Administered 2021-12-24: 3.375 g via INTRAVENOUS
  Filled 2021-12-24: qty 50

## 2021-12-24 MED ORDER — VANCOMYCIN HCL 2000 MG/400ML IV SOLN
2000.0000 mg | Freq: Once | INTRAVENOUS | Status: AC
  Administered 2021-12-24: 2000 mg via INTRAVENOUS
  Filled 2021-12-24: qty 400

## 2021-12-24 NOTE — ED Notes (Signed)
When patient tried to ambulating. Pt got bleeding again. Pool of blood in the floor. EDP informed. Pending for discharge.

## 2021-12-24 NOTE — ED Provider Notes (Signed)
[facility] DEPT Provider Note   CSN: [id number] Arrival date & time: 12/24/21  1038     History {Add pertinent medical, surgical, social history, OB history to HPI:1} Chief Complaint  Patient presents with   Vaginal Bleeding    Cassandra Reese is a 31 y.o. female.  Myomectomy on 10/2 Today had bleeding and felt dizzy laid down EMS gave fluids and called 911 S/P myomectomy 10/2 on 900 ebl did ahve drop in hgb from 11 ot 7.7 on first day was to have fu with OB today Dr [facility]  given nausea medications 1 week augmentin and see in office scheduled earlier - Per Dr Lynnette Caffey       Home Medications Prior to Admission medications   Medication Sig Start Date End Date Taking? Authorizing Provider  diphenhydrAMINE (BENADRYL) 50 MG tablet Take 1 tablet (50 mg total) by mouth every 6 (six) hours. Until swelling resolves Patient taking differently: Take 50 mg by mouth every 6 (six) hours as needed. Until swelling resolves 07/08/12   Melony Overly, MD  ergocalciferol (VITAMIN D2) 1.25 MG (50000 UT) capsule Take 50,000 Units by mouth once a week.    [provider]  ibuprofen (ADVIL) 600 MG tablet Take 1 tablet (600 mg total) by mouth 4 (four) times daily. 12/11/21   Morris, Megan, DO  losartan (COZAAR) 25 MG tablet Take 25 mg by mouth every evening.    [provider]  Multiple Vitamin (MULTIVITAMIN WITH MINERALS) TABS tablet Take 1 tablet by mouth daily. Centrum Women's MVI w/iron    [provider]  PREVIDENT 5000 BOOSTER PLUS 1.1 % PSTE 2 (two) times daily. 01/28/19   [provider]  tranexamic acid (LYSTEDA) 650 MG TABS tablet Take 1,300 mg by mouth as needed. 06/24/19   [provider]      Allergies    Dust mite extract and Molds & smuts    Review of Systems   Review of Systems  Physical Exam Updated Vital Signs BP 104/72 (BP Location: Left Arm)   Pulse 87   Temp 98.4 F (36.9 C)  (Oral)   Resp 16   Ht '5\' 9"'$  (1.753 m)   Wt (!) 139.7 kg   LMP 10/31/2021 (Exact Date)   SpO2 100%   BMI 45.48 kg/m  Physical Exam  ED Results / Procedures / Treatments   Labs (all labs ordered are listed, but only abnormal results are displayed) Labs Reviewed - No data to display  EKG None  Radiology No results found.  Procedures Procedures  {Document cardiac monitor, telemetry assessment procedure when appropriate:1}  Medications Ordered in ED Medications - No data to display  ED Course/ Medical Decision Making/ A&P Clinical Course as of 12/24/21 2045  Mon Dec 24, 2021  1124 Talked to Dr Lynnette Caffey. Agrees with TVUS and current workup.  [RP]  1736 Dr Lynnette Caffey would like the patient to be dc'd with a course of augmentin and fu in clinic.  [RP]    Clinical Course User Index [RP] Fransico Meadow, MD                           Medical Decision Making Amount and/or Complexity of Data Reviewed Labs: ordered. Radiology: ordered.  Risk OTC drugs. Prescription drug management. Decision regarding hospitalization.   ***  {Document critical care time when appropriate:1} {Document review of labs and clinical decision tools ie heart score, Chads2Vasc2 etc:1}  {  Document your independent review of radiology images, and any outside records:1} {Document your discussion with family members, caretakers, and with consultants:1} {Document social determinants of health affecting pt's care:1} {Document your decision making why or why not admission, treatments were needed:1} Final Clinical Impression(s) / ED Diagnoses Final diagnoses:  None    Rx / DC Orders ED Discharge Orders     None

## 2021-12-24 NOTE — ED Triage Notes (Signed)
Per EMS- Patient had uterine fibroids removed 2 weeks ago and while getting ready for a follow up appointment, patient had a sudden onset of vaginal bleeding with a foul odor. EMS reports that the patient had approx 150 ml in the floor with clots and an unknown amount in the toilet.  Patient's BP while en route to the ED was 70/40 and EMS gave 350 ml NS prior to arrival to the ED.

## 2021-12-24 NOTE — ED Provider Notes (Signed)
31 yo F with a chief complaints of vaginal bleeding.  The patient had a myomectomy and had developed this over the course of a couple days.  Had pelvic exam without any obvious bleeding.  CT imaging was concerning for a possible abscess versus hematoma.  Clinically it sounded more like abscess without fevers.  Patient had another episode of bleeding and so plan was to watch the patient in the ER perhaps get repeat hemoglobin.  On my repeat exam the patient is having more drainage from her incision site.  Is grossly purulent.  She has leukocytosis and reported fevers earlier in the week.  I went to start her on broad-spectrum antibiotics.  I rediscussed the case with her OB/GYN who admitted to the hospital.  CRITICAL CARE Performed by: Cecilio Asper   Total critical care time: 35 minutes  Critical care time was exclusive of separately billable procedures and treating other patients.  Critical care was necessary to treat or prevent imminent or life-threatening deterioration.  Critical care was time spent personally by me on the following activities: development of treatment plan with patient and/or surrogate as well as nursing, discussions with consultants, evaluation of patient's response to treatment, examination of patient, obtaining history from patient or surrogate, ordering and performing treatments and interventions, ordering and review of laboratory studies, ordering and review of radiographic studies, pulse oximetry and re-evaluation of patient's condition.    Deno Etienne, DO 12/24/21 1955

## 2021-12-24 NOTE — Discharge Instructions (Addendum)
Today you were seen in the emergency department for your vaginal bleeding.    In the emergency department you had an ultrasound and CT that showed a post-operative fluid collection that could be infection so please take the antibiotics we have prescribed.    At home, please take the iron and medroxyprogesterone that we have prescribed you for your bleeding.  Take tylenol and motrin for pain. You may take oxycodone for any breakthrough pain you have.   Check your MyChart online for the results of any tests that had not resulted by the time you left the emergency department.   Follow-up with your primary doctor in 2-3 days regarding your visit.    Return immediately to the emergency department if you experience any of the following: fevers, worsening pain, or any other concerning symptoms.    Thank you for visiting our Emergency Department. It was a pleasure taking care of you today.

## 2021-12-24 NOTE — H&P (Signed)
Cassandra Reese is an 31 y.o. female POD#14 s/p abdominal myomectomy; presenting for incisional bleeding.  Patient was getting dressed to come into the office for postop visit today when she noticed drainage from her incision which had a foul odor.  She felt like she was going to pass out so her sister called 50.    In ED, patient had U/S which showed normal uterus but 2 hypoechoic areas seen anterior to the bladder,the largest measuring 9.5 x 9.1 x 2.5 cm.  WBC 15.1 with left shift.  CT noted a loculated fluid collection measuring 8.4 x 10.2 x 3.1 cm in the anterior margin of rectus muscles in midline in suprapubic region immediately behind the rectus sheath.   Patient reports doing well after discharge home but she felt feverish last weekend.  She did not take her temperature.  She again felt feverish this past weekend but no temperature taken.  Reports normal bowel and bladder function since surgery.  Vaginal bleeding stopped x 1 week after the procedure.  No N/V.    Pertinent Gynecological History: Menses:  postop s/p myomectomy Bleeding: regular Contraception: abstinence DES exposure: unknown Blood transfusions: none Sexually transmitted diseases: no past history Previous GYN Procedures:  myomectomy   Last mammogram:  n/a  Date: n/a Last pap: normal Date: 05/01/21 OB History: G0   Menstrual History: Menarche age: n/a Patient's last menstrual period was 10/31/2021 (exact date).    Past Medical History:  Diagnosis Date   COVID 02/20/2019   fever, HA, myalgias, arthralgias, cough, mild SOB, vomiting, loss of taste & smell   Hypertension    Iron deficiency anemia due to chronic blood loss 07/12/2019   hx of multiple iron infustions, followed by Sheryn Bison, FNP @ [facility], LOV 10/25/19 in [place].   Menometrorrhagia 07/12/2019   Patient follows with Dr. Linda Hedges and is scheduled for an abdominal myomectomy on 12/10/21.   Pre-Diabetes    HgbA1c 5.8 on  05/08/21   Wears contact lenses    Wears glasses     Past Surgical History:  Procedure Laterality Date   MYOMECTOMY N/A 12/10/2021   Procedure: ABDOMINAL MYOMECTOMY;  Surgeon: Linda Hedges, DO;  Location: [facility];  Service: Gynecology;  Laterality: N/A;   WISDOM TOOTH EXTRACTION  2015    Family History  Problem Relation Age of Onset   Diabetes Mother    Hypertension Mother    Heart disease Father     Social History:  reports that she has never smoked. She has never used smokeless tobacco. She reports current alcohol use. She reports that she does not use drugs.  Allergies:  Allergies  Allergen Reactions   Dust Mite Extract Swelling    Generalized swelling, throat swells   Molds & Smuts Swelling    Generalized swelling, throat swells    Medications Prior to Admission  Medication Sig Dispense Refill Last Dose   diphenhydrAMINE (BENADRYL) 50 MG tablet Take 1 tablet (50 mg total) by mouth every 6 (six) hours. Until swelling resolves (Patient taking differently: Take 50 mg by mouth every 6 (six) hours as needed. Until swelling resolves) 30 tablet 0    ergocalciferol (VITAMIN D2) 1.25 MG (50000 UT) capsule Take 50,000 Units by mouth once a week.      losartan (COZAAR) 25 MG tablet Take 25 mg by mouth every evening.      Multiple Vitamin (MULTIVITAMIN WITH MINERALS) TABS tablet Take 1 tablet by mouth daily. Centrum Women's MVI w/iron  PREVIDENT 5000 BOOSTER PLUS 1.1 % PSTE 2 (two) times daily.       Review of Systems  Constitutional:  Positive for chills and fatigue.  HENT: Negative.    Eyes: Negative.   Respiratory: Negative.    Cardiovascular: Negative.   Gastrointestinal:  Positive for abdominal pain and nausea. Negative for constipation, diarrhea and vomiting.  Endocrine: Negative.   Genitourinary: Negative.   Musculoskeletal: Negative.   Skin:  Positive for wound.  Allergic/Immunologic: Negative.   Neurological: Negative.   Hematological:  Negative.   Psychiatric/Behavioral: Negative.      Blood pressure 139/79, pulse 80, temperature 99.8 F (37.7 C), resp. rate 18, height '5\' 9"'$  (1.753 m), weight (!) 139.7 kg, last menstrual period 10/31/2021, SpO2 98 %.  Physical Exam Constitutional:      Appearance: Normal appearance.  HENT:     Head: Normocephalic and atraumatic.     Nose: Nose normal.  Cardiovascular:     Rate and Rhythm: Normal rate.  Pulmonary:     Effort: Pulmonary effort is normal.  Abdominal:     General: A surgical scar is present.     Palpations: Abdomen is soft.     Tenderness: There is abdominal tenderness in the suprapubic area.    Musculoskeletal:        General: Normal range of motion.     Cervical back: Normal range of motion.  Skin:    General: Skin is warm and dry.  Neurological:     Mental Status: She is alert and oriented to person, place, and time.  Psychiatric:        Mood and Affect: Mood normal.        Behavior: Behavior normal.     Results for orders placed or performed during the hospital encounter of 12/24/21 (from the past 24 hour(s))  Comprehensive metabolic panel     Status: Abnormal   Collection Time: 12/24/21 11:40 AM  Result Value Ref Range   Sodium 134 (L) 135 - 145 mmol/L   Potassium 3.4 (L) 3.5 - 5.1 mmol/L   Chloride 101 98 - 111 mmol/L   CO2 26 22 - 32 mmol/L   Glucose, Bld 95 70 - 99 mg/dL   BUN 11 6 - 20 mg/dL   Creatinine, Ser 0.82 0.44 - 1.00 mg/dL   Calcium 8.2 (L) 8.9 - 10.3 mg/dL   Total Protein 7.4 6.5 - 8.1 g/dL   Albumin 2.8 (L) 3.5 - 5.0 g/dL   AST 14 (L) 15 - 41 U/L   ALT 14 0 - 44 U/L   Alkaline Phosphatase 66 38 - 126 U/L   Total Bilirubin 0.5 0.3 - 1.2 mg/dL   GFR, Estimated >60 >60 mL/min   Anion gap 7 5 - 15  CBC with Diff     Status: Abnormal   Collection Time: 12/24/21 11:40 AM  Result Value Ref Range   WBC 15.0 (H) 4.0 - 10.5 K/uL   RBC 3.55 (L) 3.87 - 5.11 MIL/uL   Hemoglobin 8.6 (L) 12.0 - 15.0 g/dL   HCT 27.9 (L) 36.0 - 46.0 %    MCV 78.6 (L) 80.0 - 100.0 fL   MCH 24.2 (L) 26.0 - 34.0 pg   MCHC 30.8 30.0 - 36.0 g/dL   RDW 15.5 11.5 - 15.5 %   Platelets 441 (H) 150 - 400 K/uL   nRBC 0.0 0.0 - 0.2 %   Neutrophils Relative % 85 %   Neutro Abs 12.8 (H) 1.7 - 7.7 K/uL  Lymphocytes Relative 7 %   Lymphs Abs 1.1 0.7 - 4.0 K/uL   Monocytes Relative 7 %   Monocytes Absolute 1.0 0.1 - 1.0 K/uL   Eosinophils Relative 0 %   Eosinophils Absolute 0.0 0.0 - 0.5 K/uL   Basophils Relative 0 %   Basophils Absolute 0.0 0.0 - 0.1 K/uL   Immature Granulocytes 1 %   Abs Immature Granulocytes 0.08 (H) 0.00 - 0.07 K/uL  Protime-INR     Status: Abnormal   Collection Time: 12/24/21 11:40 AM  Result Value Ref Range   Prothrombin Time 18.2 (H) 11.4 - 15.2 seconds   INR 1.5 (H) 0.8 - 1.2  APTT     Status: None   Collection Time: 12/24/21 11:40 AM  Result Value Ref Range   aPTT 32 24 - 36 seconds  Type and screen [place]     Status: None   Collection Time: 12/24/21 11:40 AM  Result Value Ref Range   ABO/RH(D) O NEG    Antibody Screen NEG    Sample Expiration      12/27/2021,2359 Performed at [facility], [place] [address], [place], [place] [address]   I-Stat beta hCG blood, ED     Status: None   Collection Time: 12/24/21 11:46 AM  Result Value Ref Range   I-stat hCG, quantitative <5.0 <5 mIU/mL   Comment 3          CBC     Status: Abnormal   Collection Time: 12/24/21  2:07 PM  Result Value Ref Range   WBC 15.1 (H) 4.0 - 10.5 K/uL   RBC 3.29 (L) 3.87 - 5.11 MIL/uL   Hemoglobin 8.1 (L) 12.0 - 15.0 g/dL   HCT 26.0 (L) 36.0 - 46.0 %   MCV 79.0 (L) 80.0 - 100.0 fL   MCH 24.6 (L) 26.0 - 34.0 pg   MCHC 31.2 30.0 - 36.0 g/dL   RDW 15.5 11.5 - 15.5 %   Platelets 427 (H) 150 - 400 K/uL   nRBC 0.0 0.0 - 0.2 %    CT ABDOMEN PELVIS W CONTRAST  Result Date: 12/24/2021 CLINICAL DATA:  Recent myomectomy, abnormal pelvic sonogram EXAM: CT ABDOMEN AND PELVIS WITH CONTRAST TECHNIQUE:  Multidetector CT imaging of the abdomen and pelvis was performed using the standard protocol following bolus administration of intravenous contrast. RADIATION DOSE REDUCTION: This exam was performed according to the departmental dose-optimization program which includes automated exposure control, adjustment of the mA and/or kV according to patient size and/or use of iterative reconstruction technique. CONTRAST:  151m OMNIPAQUE IOHEXOL 300 MG/ML  SOLN COMPARISON:  Pelvic sonogram done earlier today FINDINGS: Lower chest: Small linear densities are seen in posterior lower lung fields suggesting minimal scarring or minimal subsegmental atelectasis. Hepatobiliary: Liver measures 22 cm in length. No focal abnormalities are seen. There is no dilation of bile ducts. There is subtle increased density in the dependent portion of gallbladder lumen. There is no wall thickening. Pancreas: No focal abnormalities are seen. Spleen: Spleen measures 13.4 cm in maximum diameter. Adrenals/Urinary Tract: Adrenals are unremarkable. There is no hydronephrosis. There are no renal or ureteral stones. Urinary bladder is unremarkable. Stomach/Bowel: Small hiatal hernia is seen. Stomach is not distended. Small bowel loops are not dilated. Appendix is not dilated. There is no significant wall thickening in colon. There is no pericolic stranding. Vascular/Lymphatic: Vascular structures are unremarkable. There are slightly enlarged lymph nodes in retroperitoneum and inguinal regions largest measuring 10 mm in short axis.  Reproductive: Uterus measures 11.3 x 5.1 x 8.7 cm in length, AP and transverse diameters. There is minimal amount of fluid in the endometrial cavity. There is 2.7 cm fluid density structure in the right adnexa. There is 2.8 cm fluid density structure in the left adnexa. These may suggest follicles or cysts in the ovaries. Other: There is no ascites or pneumoperitoneum. There is a loculated fluid collection measuring 8.4 x 10.2  x 3.1 cm in the anterior margin of rectus muscles in midline in suprapubic region immediately behind the rectus sheath. There are no pockets of air within this mixed attenuation fluid collection. There is subcutaneous stranding in suprapubic region without any thick-walled loculated fluid collections. There is small pocket of air in subcutaneous plane in the right suprapubic region, possibly related to recent surgery. Less likely possibility would be infectious process. Musculoskeletal: No acute findings are seen. IMPRESSION: There is no evidence of intestinal obstruction or pneumoperitoneum. There is no hydronephrosis. Appendix is not dilated. There is no ascites or pneumoperitoneum. There is a loculated fluid collection measuring 8.4 x 10.2 x 3.1 cm in the anterior margin of rectus muscles in midline in suprapubic region immediately behind the rectus sheath. There are no pockets of air within this fluid collection. This may suggest resolving hematoma or early abscess. There is diffuse subcutaneous stranding and fluid without any loculated fluid collections in suprapubic region, possibly postoperative contusion or cellulitis. There is small pocket of air in the right suprapubic region in the area of stranding which may be residual from recent surgery. Another less likely possibility would be infectious process. There is no demonstrable thick-walled loculated fluid collection in the subcutaneous plane in suprapubic region. Enlarged liver and spleen. There is subtle increased density in the dependent portion of gallbladder lumen suggesting presence of sludge or tiny stones. There are no signs of acute cholecystitis. Small hiatal hernia. Small linear densities in the posterior lower lung fields may suggest minimal subsegmental atelectasis. Electronically Signed   By: Elmer Picker M.D.   On: 12/24/2021 17:15   US Pelvis Complete  Result Date: 12/24/2021 CLINICAL DATA:  Vaginal bleeding. EXAM: TRANSABDOMINAL  ULTRASOUND OF PELVIS TECHNIQUE: Transabdominal ultrasound examination of the pelvis was performed including evaluation of the uterus, ovaries, adnexal regions, and pelvic cul-de-sac. Exam was significantly limited due to body habitus and overlying bowel gas. COMPARISON:  None Available. FINDINGS: Uterus Measurements: 13.5 x 7.4 x 4.0 cm = volume: 212 mL. No fibroids or other mass visualized. Endometrium Thickness: 7 mm which is within normal limits. No focal abnormality visualized. Right ovary Not visualized. Left ovary Not visualized. Other findings: 2 hypoechoic areas are seen anterior to the bladder, the largest measuring 9.5 x 9.1 x 2.5 cm. IMPRESSION: Significantly limited exam due to body habitus and overlying bowel gas. Uterus and endometrium are unremarkable. However, ovaries are not visualized due to above limitations. Two hypoechoic areas are seen anterior to the bladder in the pelvis which potentially may represent fluid collections or abscesses. CT scan is recommended for further evaluation. Electronically Signed   By: Marijo Conception M.D.   On: 12/24/2021 12:55    Assessment/Plan: 31yo G0 POD#14 with pelvic abscess -IV abx started (Zosyn and Vanc) -NPO after MN -Labs in AM -CT guided drain placement ordered for tomorrow -Anemia-FeSO4/Colace -SCDs  Linda Hedges 12/24/2021, 10:57 PM

## 2021-12-24 NOTE — ED Notes (Signed)
Pt ambulatory to bathroom w/ standby assist for safety. Slow gait. Noted pt c/o pain where incision is located. Also, noted active bleeding where incision is located. RN aware

## 2021-12-24 NOTE — ED Notes (Signed)
ED TO INPATIENT HANDOFF REPORT  Name/Age/Gender Cassandra Reese 31 y.o. female  Code Status    Code Status Orders  (From admission, onward)           Start     Ordered   12/24/21 1955  Full code  Continuous        12/24/21 2001           Code Status History     Date Active Date Inactive Code Status Order ID Comments User Context   12/10/2021 0545 12/11/2021 1406 Full Code [id number]  Linda Hedges, DO Inpatient       Home/SNF/Other Home  Chief Complaint Wound infection after surgery [T81.49XA]  Level of Care/Admitting Diagnosis ED Disposition     ED Disposition  Admit   Condition  --   [place]: [facility] [100102]  Level of Care: Med-Surg [16]  May place patient in observation at [facility] or [place] if equivalent level of care is available:: Yes  Covid Evaluation: Asymptomatic - no recent exposure (last 10 days) testing not required  Diagnosis: Wound infection after surgery [705079]  Admitting Physician: Linda Hedges [phone]  Attending Physician: Linda Hedges [phone]          Medical History Past Medical History:  Diagnosis Date   COVID 02/20/2019   fever, HA, myalgias, arthralgias, cough, mild SOB, vomiting, loss of taste & smell   Hypertension    Iron deficiency anemia due to chronic blood loss 07/12/2019   hx of multiple iron infustions, followed by Sheryn Bison, FNP @ [facility], LOV 10/25/19 in [place].   Menometrorrhagia 07/12/2019   Patient follows with Dr. Linda Hedges and is scheduled for an abdominal myomectomy on 12/10/21.   Pre-Diabetes    HgbA1c 5.8 on 05/08/21   Wears contact lenses    Wears glasses     Allergies Allergies  Allergen Reactions   Dust Mite Extract Swelling    Generalized swelling, throat swells   Molds & Smuts Swelling    Generalized swelling, throat swells    IV Location/Drains/Wounds Patient Lines/Drains/Airways Status     Active  Line/Drains/Airways     Name Placement date Placement time Site Days   Peripheral IV 12/24/21 20 G Right Antecubital 12/24/21  2013  Antecubital  less than 1   Incision (Closed) 12/10/21 Abdomen Other (Comment) 12/10/21  0917  -- 14   Incision (Closed) 12/10/21 Vagina 12/10/21  0942  -- 14            Labs/Imaging Results for orders placed or performed during the hospital encounter of 12/24/21 (from the past 48 hour(s))  Comprehensive metabolic panel     Status: Abnormal   Collection Time: 12/24/21 11:40 AM  Result Value Ref Range   Sodium 134 (L) 135 - 145 mmol/L   Potassium 3.4 (L) 3.5 - 5.1 mmol/L   Chloride 101 98 - 111 mmol/L   CO2 26 22 - 32 mmol/L   Glucose, Bld 95 70 - 99 mg/dL    Comment: Glucose reference range applies only to samples taken after fasting for at least 8 hours.   BUN 11 6 - 20 mg/dL   Creatinine, Ser 0.82 0.44 - 1.00 mg/dL   Calcium 8.2 (L) 8.9 - 10.3 mg/dL   Total Protein 7.4 6.5 - 8.1 g/dL   Albumin 2.8 (L) 3.5 - 5.0 g/dL   AST 14 (L) 15 - 41 U/L   ALT 14 0 - 44  U/L   Alkaline Phosphatase 66 38 - 126 U/L   Total Bilirubin 0.5 0.3 - 1.2 mg/dL   GFR, Estimated >60 >60 mL/min    Comment: (NOTE) Calculated using the CKD-EPI Creatinine Equation (2021)    Anion gap 7 5 - 15    Comment: Performed at [facility], [place] [address], [place], [place] [address]  CBC with Diff     Status: Abnormal   Collection Time: 12/24/21 11:40 AM  Result Value Ref Range   WBC 15.0 (H) 4.0 - 10.5 K/uL   RBC 3.55 (L) 3.87 - 5.11 MIL/uL   Hemoglobin 8.6 (L) 12.0 - 15.0 g/dL   HCT 27.9 (L) 36.0 - 46.0 %   MCV 78.6 (L) 80.0 - 100.0 fL   MCH 24.2 (L) 26.0 - 34.0 pg   MCHC 30.8 30.0 - 36.0 g/dL   RDW 15.5 11.5 - 15.5 %   Platelets 441 (H) 150 - 400 K/uL   nRBC 0.0 0.0 - 0.2 %   Neutrophils Relative % 85 %   Neutro Abs 12.8 (H) 1.7 - 7.7 K/uL   Lymphocytes Relative 7 %   Lymphs Abs 1.1 0.7 - 4.0 K/uL   Monocytes Relative 7 %   Monocytes Absolute 1.0  0.1 - 1.0 K/uL   Eosinophils Relative 0 %   Eosinophils Absolute 0.0 0.0 - 0.5 K/uL   Basophils Relative 0 %   Basophils Absolute 0.0 0.0 - 0.1 K/uL   Immature Granulocytes 1 %   Abs Immature Granulocytes 0.08 (H) 0.00 - 0.07 K/uL    Comment: Performed at [facility], [place] [address], [place], [place] [address]  Protime-INR     Status: Abnormal   Collection Time: 12/24/21 11:40 AM  Result Value Ref Range   Prothrombin Time 18.2 (H) 11.4 - 15.2 seconds   INR 1.5 (H) 0.8 - 1.2    Comment: (NOTE) INR goal varies based on device and disease states. Performed at [facility], [place] [address] [place], [place] [address]   APTT     Status: None   Collection Time: 12/24/21 11:40 AM  Result Value Ref Range   aPTT 32 24 - 36 seconds    Comment: Performed at [facility], [place] [address] [place], [place] [address]  Type and screen [place]     Status: None   Collection Time: 12/24/21 11:40 AM  Result Value Ref Range   ABO/RH(D) O NEG    Antibody Screen NEG    Sample Expiration      12/27/2021,2359 Performed at [facility], [place] [address], [place],  [address]   I-Stat beta hCG blood, ED     Status: None   Collection Time: 12/24/21 11:46 AM  Result Value Ref Range   I-stat hCG, quantitative <5.0 <5 mIU/mL   Comment 3            Comment:   GEST. AGE      CONC.  (mIU/mL)   <=1 WEEK        5 - 50     2 WEEKS       50 - 500     3 WEEKS       100 - 10,000     4 WEEKS     1,000 - 30,000        FEMALE AND NON-PREGNANT FEMALE:     LESS THAN 5 mIU/mL   CBC  Status: Abnormal   Collection Time: 12/24/21  2:07 PM  Result Value Ref Range   WBC 15.1 (H) 4.0 - 10.5 K/uL   RBC 3.29 (L) 3.87 - 5.11 MIL/uL   Hemoglobin 8.1 (L) 12.0 - 15.0 g/dL   HCT 26.0 (L) 36.0 - 46.0 %   MCV 79.0 (L) 80.0 - 100.0 fL   MCH 24.6 (L) 26.0 - 34.0 pg   MCHC 31.2 30.0 - 36.0 g/dL   RDW 15.5 11.5 - 15.5 %    Platelets 427 (H) 150 - 400 K/uL   nRBC 0.0 0.0 - 0.2 %    Comment: Performed at [facility], [place] [address], [place], [place] [address]   CT ABDOMEN PELVIS W CONTRAST  Result Date: 12/24/2021 CLINICAL DATA:  Recent myomectomy, abnormal pelvic sonogram EXAM: CT ABDOMEN AND PELVIS WITH CONTRAST TECHNIQUE: Multidetector CT imaging of the abdomen and pelvis was performed using the standard protocol following bolus administration of intravenous contrast. RADIATION DOSE REDUCTION: This exam was performed according to the departmental dose-optimization program which includes automated exposure control, adjustment of the mA and/or kV according to patient size and/or use of iterative reconstruction technique. CONTRAST:  174m OMNIPAQUE IOHEXOL 300 MG/ML  SOLN COMPARISON:  Pelvic sonogram done earlier today FINDINGS: Lower chest: Small linear densities are seen in posterior lower lung fields suggesting minimal scarring or minimal subsegmental atelectasis. Hepatobiliary: Liver measures 22 cm in length. No focal abnormalities are seen. There is no dilation of bile ducts. There is subtle increased density in the dependent portion of gallbladder lumen. There is no wall thickening. Pancreas: No focal abnormalities are seen. Spleen: Spleen measures 13.4 cm in maximum diameter. Adrenals/Urinary Tract: Adrenals are unremarkable. There is no hydronephrosis. There are no renal or ureteral stones. Urinary bladder is unremarkable. Stomach/Bowel: Small hiatal hernia is seen. Stomach is not distended. Small bowel loops are not dilated. Appendix is not dilated. There is no significant wall thickening in colon. There is no pericolic stranding. Vascular/Lymphatic: Vascular structures are unremarkable. There are slightly enlarged lymph nodes in retroperitoneum and inguinal regions largest measuring 10 mm in short axis. Reproductive: Uterus measures 11.3 x 5.1 x 8.7 cm in length, AP and transverse diameters. There is  minimal amount of fluid in the endometrial cavity. There is 2.7 cm fluid density structure in the right adnexa. There is 2.8 cm fluid density structure in the left adnexa. These may suggest follicles or cysts in the ovaries. Other: There is no ascites or pneumoperitoneum. There is a loculated fluid collection measuring 8.4 x 10.2 x 3.1 cm in the anterior margin of rectus muscles in midline in suprapubic region immediately behind the rectus sheath. There are no pockets of air within this mixed attenuation fluid collection. There is subcutaneous stranding in suprapubic region without any thick-walled loculated fluid collections. There is small pocket of air in subcutaneous plane in the right suprapubic region, possibly related to recent surgery. Less likely possibility would be infectious process. Musculoskeletal: No acute findings are seen. IMPRESSION: There is no evidence of intestinal obstruction or pneumoperitoneum. There is no hydronephrosis. Appendix is not dilated. There is no ascites or pneumoperitoneum. There is a loculated fluid collection measuring 8.4 x 10.2 x 3.1 cm in the anterior margin of rectus muscles in midline in suprapubic region immediately behind the rectus sheath. There are no pockets of air within this fluid collection. This may suggest resolving hematoma or early abscess. There is diffuse subcutaneous stranding and fluid without any loculated fluid collections in suprapubic  region, possibly postoperative contusion or cellulitis. There is small pocket of air in the right suprapubic region in the area of stranding which may be residual from recent surgery. Another less likely possibility would be infectious process. There is no demonstrable thick-walled loculated fluid collection in the subcutaneous plane in suprapubic region. Enlarged liver and spleen. There is subtle increased density in the dependent portion of gallbladder lumen suggesting presence of sludge or tiny stones. There are no  signs of acute cholecystitis. Small hiatal hernia. Small linear densities in the posterior lower lung fields may suggest minimal subsegmental atelectasis. Electronically Signed   By: Elmer Picker M.D.   On: 12/24/2021 17:15   US Pelvis Complete  Result Date: 12/24/2021 CLINICAL DATA:  Vaginal bleeding. EXAM: TRANSABDOMINAL ULTRASOUND OF PELVIS TECHNIQUE: Transabdominal ultrasound examination of the pelvis was performed including evaluation of the uterus, ovaries, adnexal regions, and pelvic cul-de-sac. Exam was significantly limited due to body habitus and overlying bowel gas. COMPARISON:  None Available. FINDINGS: Uterus Measurements: 13.5 x 7.4 x 4.0 cm = volume: 212 mL. No fibroids or other mass visualized. Endometrium Thickness: 7 mm which is within normal limits. No focal abnormality visualized. Right ovary Not visualized. Left ovary Not visualized. Other findings: 2 hypoechoic areas are seen anterior to the bladder, the largest measuring 9.5 x 9.1 x 2.5 cm. IMPRESSION: Significantly limited exam due to body habitus and overlying bowel gas. Uterus and endometrium are unremarkable. However, ovaries are not visualized due to above limitations. Two hypoechoic areas are seen anterior to the bladder in the pelvis which potentially may represent fluid collections or abscesses. CT scan is recommended for further evaluation. Electronically Signed   By: Marijo Conception M.D.   On: 12/24/2021 12:55    Pending Labs Unresulted Labs (From admission, onward)     Start     Ordered   12/25/21 0500  Comprehensive metabolic panel  Tomorrow morning,   R        12/24/21 2001   12/25/21 0500  CBC  Tomorrow morning,   R        12/24/21 2001   12/25/21 0500  APTT  Tomorrow morning,   R        12/24/21 2001   12/25/21 0500  Protime-INR  Tomorrow morning,   R        12/24/21 2001   12/25/21 0500  Fibrinogen  Tomorrow morning,   R        12/24/21 2001            Vitals/Pain Today's Vitals   12/24/21  1730 12/24/21 1752 12/24/21 1820 12/24/21 1908  BP: 124/82 124/82 (!) 151/78 (!) 169/102  Pulse: 81 87 84 (!) 111  Resp: '16 16 16 16  '$ Temp:    98.1 F (36.7 C)  TempSrc:      SpO2: 98% 99% 99% 100%  Weight:      Height:      PainSc:  0-No pain      Isolation Precautions No active isolations  Medications Medications  vancomycin (VANCOREADY) IVPB 2000 mg/400 mL (has no administration in time range)  piperacillin-tazobactam (ZOSYN) IVPB 3.375 g (3.375 g Intravenous New Bag/Given 12/24/21 2013)  lactated ringers infusion (has no administration in time range)  acetaminophen (TYLENOL) tablet 650 mg (has no administration in time range)  docusate sodium (COLACE) capsule 100 mg (has no administration in time range)  ferrous sulfate tablet 325 mg (has no administration in time range)  HYDROmorphone (DILAUDID) injection 0.5 mg (  0.5 mg Intravenous Given 12/24/21 1140)  medroxyPROGESTERone (PROVERA) tablet 20 mg (20 mg Oral Given 12/24/21 1416)  HYDROmorphone (DILAUDID) injection 0.5 mg (0.5 mg Intravenous Given 12/24/21 1623)  iohexol (OMNIPAQUE) 300 MG/ML solution 100 mL (100 mLs Intravenous Contrast Given 12/24/21 1647)    Mobility walks with device

## 2021-12-25 LAB — CBC
HCT: 26.9 % — ABNORMAL LOW (ref 36.0–46.0)
Hemoglobin: 8 g/dL — ABNORMAL LOW (ref 12.0–15.0)
MCH: 23.7 pg — ABNORMAL LOW (ref 26.0–34.0)
MCHC: 29.7 g/dL — ABNORMAL LOW (ref 30.0–36.0)
MCV: 79.8 fL — ABNORMAL LOW (ref 80.0–100.0)
Platelets: 469 10*3/uL — ABNORMAL HIGH (ref 150–400)
RBC: 3.37 MIL/uL — ABNORMAL LOW (ref 3.87–5.11)
RDW: 15.8 % — ABNORMAL HIGH (ref 11.5–15.5)
WBC: 14.2 10*3/uL — ABNORMAL HIGH (ref 4.0–10.5)
nRBC: 0 % (ref 0.0–0.2)

## 2021-12-25 LAB — PROTIME-INR
INR: 1.2 (ref 0.8–1.2)
Prothrombin Time: 15.3 seconds — ABNORMAL HIGH (ref 11.4–15.2)

## 2021-12-25 LAB — COMPREHENSIVE METABOLIC PANEL
ALT: 12 U/L (ref 0–44)
AST: 10 U/L — ABNORMAL LOW (ref 15–41)
Albumin: 2.7 g/dL — ABNORMAL LOW (ref 3.5–5.0)
Alkaline Phosphatase: 67 U/L (ref 38–126)
Anion gap: 8 (ref 5–15)
BUN: 8 mg/dL (ref 6–20)
CO2: 25 mmol/L (ref 22–32)
Calcium: 8.5 mg/dL — ABNORMAL LOW (ref 8.9–10.3)
Chloride: 103 mmol/L (ref 98–111)
Creatinine, Ser: 0.63 mg/dL (ref 0.44–1.00)
GFR, Estimated: >60 mL/min (ref >60)
Glucose, Bld: 92 mg/dL (ref 70–99)
Potassium: 3.7 mmol/L (ref 3.5–5.1)
Sodium: 136 mmol/L (ref 135–145)
Total Bilirubin: 0.5 mg/dL (ref 0.3–1.2)
Total Protein: 6.9 g/dL (ref 6.5–8.1)

## 2021-12-25 LAB — APTT: aPTT: 28 seconds (ref 24–36)

## 2021-12-25 LAB — FIBRINOGEN: Fibrinogen: >800 mg/dL — ABNORMAL HIGH (ref 210–475)

## 2021-12-25 MED ORDER — MORPHINE SULFATE (PF) 4 MG/ML IV SOLN
4.0000 mg | INTRAVENOUS | Status: DC | PRN
  Administered 2021-12-25 (×2): 4 mg via INTRAVENOUS
  Filled 2021-12-25 (×2): qty 1

## 2021-12-25 MED ORDER — IBUPROFEN 600 MG PO TABS: 600.0000 mg | ORAL_TABLET | Freq: Four times a day (QID) | ORAL | 1 refills | Status: AC

## 2021-12-25 MED ORDER — OXYCODONE-ACETAMINOPHEN 5-325 MG PO TABS: 1.0000 | ORAL_TABLET | ORAL | 0 refills | Status: AC | PRN

## 2021-12-25 MED ORDER — PIPERACILLIN-TAZOBACTAM 3.375 G IVPB
3.3750 g | Freq: Three times a day (TID) | INTRAVENOUS | Status: DC
  Filled 2021-12-25: qty 50

## 2021-12-25 NOTE — Discharge Summary (Signed)
Physician Discharge Summary  Patient ID: Cassandra Reese MRN: [id number] DOB/AGE: 33-Dec-1992 33 y.o.  Admit date: 12/24/2021 Discharge date: 12/25/2021  Admission Diagnoses: Postop incisional bleeding  Discharge Diagnoses: Postop hematoma   Discharged Condition: good  Hospital Course: Patient presented to ED with incisional bleeding on POD#14 s/p abdominal myomectomy.  Pelvic U/S and CT were performed showing fluid collection measuring 10 cm in anterior rectus sheath.  This was suspicious for early abscess and the patient was admitted and started on Zosyn and Vanc empirically.  Plans were for IR drain placement on HD2.  IR felt certain the fluid collection was hematoma and drain placement was not indication.  Given drainage from incision and no fever, the decision was made to discharge the patient with close follow up in the office, pain control and po antibiotics.    Consults:  IR  Significant Diagnostic Studies: radiology: CT scan: pelvis and Ultrasound: pelvis  Treatments: antibiotics: Zosyn and vancomycin  Discharge Exam: Blood pressure 139/82, pulse 76, temperature 98.4 F (36.9 C), temperature source Oral, resp. rate 18, height '5\' 9"'$  (1.753 m), weight (!) 139.7 kg, last menstrual period 10/31/2021, SpO2 98 %. General appearance: alert, cooperative, and appears stated age Extremities: extremities normal, atraumatic, no cyanosis or edema Incision/Wound:Drainage of thin dark blood from left side of incision; pinpoint opening  Disposition: Discharge disposition: 01-Home or Self Care       Discharge Instructions     Discharge patient   Complete by: As directed    Discharge disposition: 01-Home or Self Care   Discharge patient date: 12/25/2021      Allergies as of 12/25/2021       Reactions   Dust Mite Extract Swelling   Generalized swelling, throat swells   Molds & Smuts Swelling   Generalized swelling, throat swells        Medication List     STOP taking  these medications    tranexamic acid 650 MG Tabs tablet Commonly known as: LYSTEDA       TAKE these medications    amoxicillin-clavulanate 875-125 MG tablet Commonly known as: AUGMENTIN Take 1 tablet by mouth every 12 (twelve) hours.   diphenhydrAMINE 50 MG tablet Commonly known as: BENADRYL Take 1 tablet (50 mg total) by mouth every 6 (six) hours. Until swelling resolves   ergocalciferol 1.25 MG (50000 UT) capsule Commonly known as: VITAMIN D2 Take 50,000 Units by mouth once a week.   ferrous sulfate 325 (65 FE) MG tablet Take 1 tablet (325 mg total) by mouth daily.   ibuprofen 600 MG tablet Commonly known as: ADVIL Take 1 tablet (600 mg total) by mouth 4 (four) times daily for 30 doses. What changed: Another medication with the same name was added. Make sure you understand how and when to take each.   ibuprofen 600 MG tablet Commonly known as: ADVIL Take 1 tablet (600 mg total) by mouth 4 (four) times daily. What changed: You were already taking a medication with the same name, and this prescription was added. Make sure you understand how and when to take each.   losartan 25 MG tablet Commonly known as: COZAAR Take 25 mg by mouth every evening.   medroxyPROGESTERone 5 MG tablet Commonly known as: PROVERA Take 4 tablets (20 mg total) by mouth daily for 7 days.   multivitamin with minerals Tabs tablet Take 1 tablet by mouth daily. Centrum Women's MVI w/iron   oxyCODONE 5 MG immediate release tablet Commonly known as: Roxicodone Take 1 tablet (5 mg  total) by mouth every 6 (six) hours as needed for severe pain.   oxyCODONE-acetaminophen 5-325 MG tablet Commonly known as: Percocet Take 1 tablet by mouth every 4 (four) hours as needed for up to 7 days for severe pain.   polyethylene glycol 17 g packet Commonly known as: MiraLax Take 17 g by mouth daily.        Follow-up Information     Schedule an appointment as soon as possible for a visit  with Linda Hedges, DO.   Specialty: Obstetrics and Gynecology Contact information: [address] [place] [address] [phone]         Go to  [place] DEPT.   Specialty: Emergency Medicine Why: As needed, If symptoms worsen Contact information: [place] [id number] [place] [address] [phone]                Signed: Linda Hedges 12/25/2021, 2:34 PM

## 2021-12-25 NOTE — Progress Notes (Signed)
  Request seen for image guided drain placement.  Images reviewed by Dr. Kathlene Cote.  The fluid collection is hyperdense c/w post-op rectus hematoma. It does not appear liquified and likely would not drain much.  Recommend follow unless very strong signs clinically of infection.  Amori Cooperman S Cristabel Bicknell PA-C 12/25/2021 8:34 AM

## 2021-12-25 NOTE — Progress Notes (Signed)
Pharmacy Antibiotic Note  Cassandra Reese is a 31 y.o. female s/p myomectomy on 12/10/21 for uterine fibroids/menorrhagia  who presented to the ED on 12/24/2021 with c/o vaginal bleeding.  Patient also reported fever and drainage from incision site, Abdominal CT on 10/16 showed fluid collection in the suprapubic region that could be hematoma or early abscess. Pharmacy has been consulted to dose zosyn for suspected pelvic abscess/wound infection  Today, 12/25/2021: - scr 0.63 (crcl~100) - wbc 14.2 - Tmax 99.8  Plan: - zosyn 3.375 gm IV q8h (infuse over 4 hrs) - With stable renal function, pharmacy will sign off for abx consult.  Reconsult [place] if need further assistance.  ___________________________________________  Height: '5\' 9"'$  (175.3 cm) Weight: (!) 139.7 kg (308 lb) IBW/kg (Calculated) : 66.2  Temp (24hrs), Avg:98.7 F (37.1 C), Min:98.1 F (36.7 C), Max:99.8 F (37.7 C)  Recent Labs  Lab 12/24/21 1140 12/24/21 1407 12/25/21 0431  WBC 15.0* 15.1* 14.2*  CREATININE 0.82  --  0.63    Estimated Creatinine Clearance: 151 mL/min (by C-G formula based on SCr of 0.63 mg/dL).    Allergies  Allergen Reactions   Dust Mite Extract Swelling    Generalized swelling, throat swells   Molds & Smuts Swelling    Generalized swelling, throat swells     Thank you for allowing pharmacy to be a part of this patient's care.  Lynelle Doctor 12/25/2021 9:15 AM

## 2021-12-25 NOTE — Progress Notes (Signed)
No current c/o other than hungry.  Patient was kept NPO since MN in preparation for IR drain placement.  No N/V.  No subjective fever or chills.  Pain well controlled with IV morphine.  Incision continues to drain mostly on standing.       12/25/2021   11:54 AM 12/25/2021    8:39 AM 12/25/2021    5:48 AM  Vitals with BMI  Systolic 168 372 902  Diastolic 82 98 77  Pulse 76 78 73      Latest Ref Rng & Units 12/25/2021    4:31 AM 12/24/2021    2:07 PM 12/24/2021   11:40 AM  CBC  WBC 4.0 - 10.5 K/uL 14.2  15.1  15.0   Hemoglobin 12.0 - 15.0 g/dL 8.0  8.1  8.6   Hematocrit 36.0 - 46.0 % 26.9  26.0  27.9   Platelets 150 - 400 K/uL 469  427  441       Latest Ref Rng & Units 12/25/2021    4:31 AM 12/24/2021   11:40 AM 12/11/2021   12:45 AM  CMP  Glucose 70 - 99 mg/dL 92  95  138   BUN 6 - 20 mg/dL '8  11  9   '$ Creatinine 0.44 - 1.00 mg/dL 0.63  0.82  0.81   Sodium 135 - 145 mmol/L 136  134  135   Potassium 3.5 - 5.1 mmol/L 3.7  3.4  4.3   Chloride 98 - 111 mmol/L 103  101  103   CO2 22 - 32 mmol/L '25  26  26   '$ Calcium 8.9 - 10.3 mg/dL 8.5  8.2  8.3   Total Protein 6.5 - 8.1 g/dL 6.9  7.4  5.8   Total Bilirubin 0.3 - 1.2 mg/dL 0.5  0.5  0.6   Alkaline Phos 38 - 126 U/L 67  66  40   AST 15 - 41 U/L '10  14  18   '$ ALT 0 - 44 U/L '12  14  10    '$ Gen: A&O x 3 Abd: soft, tender in suprapubic region.  Incision continues to drain thin dark bloody drainage from pinpoint opening on left side of incision.  Skin erythema is resolved.   Ext: no c/c/e.  SCD are not on (per patient they were removed this AM around 7 when they were stained with blood)  HD2/POD15 s/p abdominal myomectomy with postop hematoma -I spoke with Dr. Kathlene Cote of IR who does not feel there is infectious process currently.  He does not feel drain placement would be helpful as he feels certain the fluid collection is hematoma at this time.   -Regular diet resumed -Will discharge patient home with short term office follow up,  pain medication and po abx.   -Patient counseled that given the size of the hematoma, I suspect she will continue to have incisional drainage x several days to a couple weeks.   -Strict infection precautions given  Linda Hedges, DO
